# Patient Record
Sex: Male | Born: 1985 | Hispanic: Refuse to answer | Smoking: Never smoker
Health system: Southern US, Community
[De-identification: ages and names within clinical notes are randomized; demographics above are authoritative.]

## PROBLEM LIST (undated history)

## (undated) DIAGNOSIS — I10 Essential (primary) hypertension: Secondary | ICD-10-CM

## (undated) DIAGNOSIS — E785 Hyperlipidemia, unspecified: Secondary | ICD-10-CM

## (undated) DIAGNOSIS — T7840XA Allergy, unspecified, initial encounter: Secondary | ICD-10-CM

## (undated) DIAGNOSIS — E669 Obesity, unspecified: Secondary | ICD-10-CM

## (undated) DIAGNOSIS — E119 Type 2 diabetes mellitus without complications: Secondary | ICD-10-CM

## (undated) DIAGNOSIS — E559 Vitamin D deficiency, unspecified: Secondary | ICD-10-CM

## (undated) HISTORY — DX: Vitamin D deficiency, unspecified: E55.9

## (undated) HISTORY — DX: Essential (primary) hypertension: I10

## (undated) HISTORY — DX: Allergy, unspecified, initial encounter: T78.40XA

## (undated) HISTORY — DX: Hyperlipidemia, unspecified: E78.5

## (undated) HISTORY — DX: Type 2 diabetes mellitus without complications: E11.9

## (undated) HISTORY — DX: Obesity, unspecified: E66.9

---

## 2005-01-27 HISTORY — PX: TONSILLECTOMY: SUR1361

## 2012-12-08 ENCOUNTER — Ambulatory Visit: Payer: Self-pay | Admitting: Physician Assistant

## 2012-12-23 ENCOUNTER — Encounter: Payer: Self-pay | Admitting: Internal Medicine

## 2012-12-23 DIAGNOSIS — E119 Type 2 diabetes mellitus without complications: Secondary | ICD-10-CM | POA: Insufficient documentation

## 2012-12-23 DIAGNOSIS — T7840XA Allergy, unspecified, initial encounter: Secondary | ICD-10-CM | POA: Insufficient documentation

## 2012-12-23 DIAGNOSIS — E1169 Type 2 diabetes mellitus with other specified complication: Secondary | ICD-10-CM | POA: Insufficient documentation

## 2012-12-23 DIAGNOSIS — I10 Essential (primary) hypertension: Secondary | ICD-10-CM | POA: Insufficient documentation

## 2012-12-23 DIAGNOSIS — E785 Hyperlipidemia, unspecified: Secondary | ICD-10-CM | POA: Insufficient documentation

## 2012-12-23 DIAGNOSIS — E1165 Type 2 diabetes mellitus with hyperglycemia: Secondary | ICD-10-CM | POA: Insufficient documentation

## 2012-12-23 DIAGNOSIS — E559 Vitamin D deficiency, unspecified: Secondary | ICD-10-CM | POA: Insufficient documentation

## 2012-12-23 DIAGNOSIS — IMO0002 Reserved for concepts with insufficient information to code with codable children: Secondary | ICD-10-CM | POA: Insufficient documentation

## 2012-12-27 ENCOUNTER — Ambulatory Visit (INDEPENDENT_AMBULATORY_CARE_PROVIDER_SITE_OTHER): Payer: BC Managed Care – PPO | Admitting: Physician Assistant

## 2012-12-27 ENCOUNTER — Encounter: Payer: Self-pay | Admitting: Physician Assistant

## 2012-12-27 ENCOUNTER — Other Ambulatory Visit: Payer: Self-pay | Admitting: Physician Assistant

## 2012-12-27 VITALS — BP 132/82 | HR 60 | Temp 98.2°F | Resp 16 | Wt 247.0 lb

## 2012-12-27 DIAGNOSIS — E782 Mixed hyperlipidemia: Secondary | ICD-10-CM

## 2012-12-27 DIAGNOSIS — E785 Hyperlipidemia, unspecified: Secondary | ICD-10-CM

## 2012-12-27 DIAGNOSIS — E119 Type 2 diabetes mellitus without complications: Secondary | ICD-10-CM

## 2012-12-27 DIAGNOSIS — I10 Essential (primary) hypertension: Secondary | ICD-10-CM

## 2012-12-27 LAB — HEPATIC FUNCTION PANEL
ALT: 79 U/L — ABNORMAL HIGH (ref 0–53)
AST: 42 U/L — ABNORMAL HIGH (ref 0–37)
Albumin: 5 g/dL (ref 3.5–5.2)
Alkaline Phosphatase: 66 U/L (ref 39–117)
Bilirubin, Direct: 0.1 mg/dL (ref 0.0–0.3)
Indirect Bilirubin: 0.4 mg/dL (ref 0.0–0.9)
Total Bilirubin: 0.5 mg/dL (ref 0.3–1.2)
Total Protein: 7.7 g/dL (ref 6.0–8.3)

## 2012-12-27 LAB — LIPID PANEL
Cholesterol: 239 mg/dL — ABNORMAL HIGH (ref 0–200)
HDL: 44 mg/dL (ref >39)
LDL Cholesterol: 171 mg/dL — ABNORMAL HIGH (ref 0–99)
Total CHOL/HDL Ratio: 5.4 Ratio
Triglycerides: 122 mg/dL (ref <150)
VLDL: 24 mg/dL (ref 0–40)

## 2012-12-27 LAB — BASIC METABOLIC PANEL WITHOUT GFR
BUN: 10 mg/dL (ref 6–23)
CO2: 25 meq/L (ref 19–32)
Calcium: 10.4 mg/dL (ref 8.4–10.5)
Chloride: 103 meq/L (ref 96–112)
Creat: 1.07 mg/dL (ref 0.50–1.35)
GFR, Est African American: >89 mL/min
GFR, Est Non African American: >89 mL/min
Glucose, Bld: 100 mg/dL — ABNORMAL HIGH (ref 70–99)
Potassium: 4.3 meq/L (ref 3.5–5.3)
Sodium: 139 meq/L (ref 135–145)

## 2012-12-27 NOTE — Patient Instructions (Signed)

## 2012-12-27 NOTE — Progress Notes (Signed)
HPI Patient presents for a one month follow up. He has had recent weight gains and so last visit his medication were changed from Metformin to Invokamet. Weight is down 10 lbs with the new medications. He states that his BP is better at home with the invokamet and he denies dizziness. He is peeing more but this is due to invokana. He also states he has been trying to eat better and working out. His cholesterol was 165 so he was started on the crestor 40mg  1/2 pill QOD and denies myalgias.   Past Medical History  Diagnosis Date  . Hyperlipidemia   . Hypertension   . Type II or unspecified type diabetes mellitus without mention of complication, not stated as uncontrolled   . Allergy   . Vitamin D deficiency      No Known Allergies    Current Outpatient Prescriptions on File Prior to Visit  Medication Sig Dispense Refill  . Canagliflozin-Metformin HCl (INVOKAMET) [id number] MG TABS Take by mouth 2 (two) times daily.      . Cholecalciferol (VITAMIN D PO) Take 5,000 Int'l Units by mouth daily.      . rosuvastatin (CRESTOR) 40 MG tablet Take 40 mg by mouth daily. Taking 1/2 daily       No current facility-administered medications on file prior to visit.    ROS: all negative expect above.   Physical: Filed Weights   12/27/12 1105  Weight: 247 lb (112.038 kg)   Filed Vitals:   12/27/12 1105  BP: 132/82  Pulse: 60  Temp: 98.2 F (36.8 C)  Resp: 16   General Appearance: Well nourished, in no apparent distress. Eyes: PERRLA, EOMs. Sinuses: No Frontal/maxillary tenderness ENT/Mouth: Ext aud canals clear, normal light reflex with TMs without erythema, bulging. Post pharynx without erythema, swelling, exudate.  Respiratory: CTAB Cardio: RRR, no murmurs, rubs or gallops. Peripheral pulses brisk and equal bilaterally, without edema. No aortic or femoral bruits. Abdomen: Flat, soft, with bowl sounds. Nontender, no guarding, rebound. Lymphatics: Non tender without lymphadenopathy.   Musculoskeletal: Full ROM all peripheral extremities, 5/5 strength, and normal gait. Skin: Warm, dry without rashes, lesions, ecchymosis.  Neuro: Cranial nerves intact, reflexes equal bilaterally. Normal muscle tone, no cerebellar symptoms. Sensation intact.  Pysch: Awake and oriented X 3, normal affect, Insight and Judgment appropriate.   Assessment and Plan: 1. Type II or unspecified type diabetes mellitus without mention of complication, not stated as uncontrolled Continue invokamet and weight loss - BASIC METABOLIC PANEL WITH GFR  2. Hyperlipidemia Continue Crestor - Hepatic function panel - Lipid panel

## 2012-12-28 LAB — HEPATITIS PANEL, ACUTE
HCV Ab: NEGATIVE
Hep A IgM: NONREACTIVE
Hep B C IgM: NONREACTIVE
Hepatitis B Surface Ag: NEGATIVE

## 2013-02-02 ENCOUNTER — Other Ambulatory Visit: Payer: Self-pay | Admitting: Physician Assistant

## 2013-02-02 DIAGNOSIS — E785 Hyperlipidemia, unspecified: Secondary | ICD-10-CM

## 2013-02-03 ENCOUNTER — Other Ambulatory Visit: Payer: Self-pay

## 2013-03-09 ENCOUNTER — Other Ambulatory Visit: Payer: Self-pay | Admitting: Physician Assistant

## 2013-03-09 MED ORDER — ROSUVASTATIN CALCIUM 40 MG PO TABS: 40.0000 mg | ORAL_TABLET | Freq: Every day | ORAL | Status: DC

## 2013-03-09 MED ORDER — LISINOPRIL 40 MG PO TABS: 40.0000 mg | ORAL_TABLET | Freq: Every day | ORAL | Status: DC

## 2013-03-15 ENCOUNTER — Encounter: Payer: Self-pay | Admitting: Physician Assistant

## 2013-04-19 ENCOUNTER — Encounter: Payer: Self-pay | Admitting: Physician Assistant

## 2013-05-26 ENCOUNTER — Encounter: Payer: Self-pay | Admitting: Physician Assistant

## 2013-05-26 ENCOUNTER — Ambulatory Visit (INDEPENDENT_AMBULATORY_CARE_PROVIDER_SITE_OTHER): Payer: BC Managed Care – PPO | Admitting: Physician Assistant

## 2013-05-26 VITALS — BP 132/82 | HR 88 | Temp 97.9°F | Resp 16 | Ht 69.0 in | Wt 245.0 lb

## 2013-05-26 DIAGNOSIS — Z1159 Encounter for screening for other viral diseases: Secondary | ICD-10-CM

## 2013-05-26 DIAGNOSIS — E559 Vitamin D deficiency, unspecified: Secondary | ICD-10-CM

## 2013-05-26 DIAGNOSIS — Z23 Encounter for immunization: Secondary | ICD-10-CM

## 2013-05-26 DIAGNOSIS — E119 Type 2 diabetes mellitus without complications: Secondary | ICD-10-CM

## 2013-05-26 DIAGNOSIS — R740 Nonspecific elevation of levels of transaminase and lactic acid dehydrogenase [LDH]: Secondary | ICD-10-CM

## 2013-05-26 DIAGNOSIS — R7402 Elevation of levels of lactic acid dehydrogenase (LDH): Secondary | ICD-10-CM

## 2013-05-26 DIAGNOSIS — R7401 Elevation of levels of liver transaminase levels: Secondary | ICD-10-CM

## 2013-05-26 DIAGNOSIS — I10 Essential (primary) hypertension: Secondary | ICD-10-CM

## 2013-05-26 DIAGNOSIS — Z0000 Encounter for general adult medical examination without abnormal findings: Secondary | ICD-10-CM

## 2013-05-26 DIAGNOSIS — Z79899 Other long term (current) drug therapy: Secondary | ICD-10-CM

## 2013-05-26 LAB — CBC WITH DIFFERENTIAL/PLATELET
Basophils Absolute: 0 10*3/uL (ref 0.0–0.1)
Basophils Relative: 0 % (ref 0–1)
Eosinophils Absolute: 0.1 10*3/uL (ref 0.0–0.7)
Eosinophils Relative: 1 % (ref 0–5)
HCT: 44.8 % (ref 39.0–52.0)
Hemoglobin: 15.5 g/dL (ref 13.0–17.0)
Lymphocytes Relative: 43 % (ref 12–46)
Lymphs Abs: 2.4 10*3/uL (ref 0.7–4.0)
MCH: 28.2 pg (ref 26.0–34.0)
MCHC: 34.6 g/dL (ref 30.0–36.0)
MCV: 81.6 fL (ref 78.0–100.0)
Monocytes Absolute: 0.3 10*3/uL (ref 0.1–1.0)
Monocytes Relative: 6 % (ref 3–12)
Neutro Abs: 2.8 10*3/uL (ref 1.7–7.7)
Neutrophils Relative %: 50 % (ref 43–77)
Platelets: 317 10*3/uL (ref 150–400)
RBC: 5.49 MIL/uL (ref 4.22–5.81)
RDW: 14 % (ref 11.5–15.5)
WBC: 5.6 10*3/uL (ref 4.0–10.5)

## 2013-05-26 NOTE — Patient Instructions (Addendum)
Bad carbs also include fruit juice, alcohol, and sweet tea. These are empty calories that do not signal to your brain that you are full.   Please remember the good carbs are still carbs which convert into sugar. So please measure them out no more than 1/2-1 cup of rice, oatmeal, pasta, and beans.  Veggies are however free foods! Pile them on.   I like lean protein at every meal such as chicken, Mal[place]urkey, pork chops, cottage cheese, etc. Just do not fry these meats and please center your meal around vegetable, the meats should be a side dish.   Not all fruit is created equal. Please see the list below, the fruit at the bottom is higher in sugars than the fruit at the top   Obesity Obesity is defined as having too much total body fat and a body mass index (BMI) of 30 or more. BMI is an estimate of body fat and is calculated from your height and weight. Obesity happens when you consume more calories than you can burn by exercising or performing daily physical tasks. Prolonged obesity can cause major illnesses or emergencies, such as:   A stroke.  Heart disease.  Diabetes.  Cancer.  Arthritis.  High blood pressure (hypertension).  High cholesterol.  Sleep apnea.  Erectile dysfunction.  Infertility problems. CAUSES   Regularly eating unhealthy foods.  Physical inactivity.  Certain disorders, such as an underactive thyroid (hypothyroidism), Cushing's syndrome, and polycystic ovarian syndrome.  Certain medicines, such as steroids, some depression medicines, and antipsychotics.  Genetics.  Lack of sleep. DIAGNOSIS  A caregiver can diagnose obesity after calculating your BMI. Obesity will be diagnosed if your BMI is 30 or higher.  There are other methods of measuring obesity levels. Some other methods include measuring your skin fold thickness, your waist circumference, and comparing your hip circumference to your waist circumference. TREATMENT  A healthy treatment program  includes some or all of the following:  Long-term dietary changes.  Exercise and physical activity.  Behavioral and lifestyle changes.  Medicine only under the supervision of your caregiver. Medicines may help, but only if they are used with diet and exercise programs. An unhealthy treatment program includes:  Fasting.  Fad diets.  Supplements and drugs. These choices do not succeed in long-term weight control.  HOME CARE INSTRUCTIONS   Exercise and perform physical activity as directed by your caregiver. To increase physical activity, try the following:  Use stairs instead of elevators.  Park farther away from store entrances.  Garden, bike, or walk instead of watching television or using the computer.  Eat healthy, low-calorie foods and drinks on a regular basis. Eat more fruits and vegetables. Use low-calorie cookbooks or take healthy cooking classes.  Limit fast food, sweets, and processed snack foods.  Eat smaller portions.  Keep a daily journal of everything you eat. There are many free websites to help you with this. It may be helpful to measure your foods so you can determine if you are eating the correct portion sizes.  Avoid drinking alcohol. Drink more water and drinks without calories.  Take vitamins and supplements only as recommended by your caregiver.  Weight-loss support groups, Optometristegistered Dieticians, counselors, and stress reduction education can also be very helpful. SEEK IMMEDIATE MEDICAL CARE IF:  You have chest pain or tightness.  You have trouble breathing or feel short of breath.  You have weakness or leg numbness.  You feel confused or have trouble talking.  You have sudden changes  in your vision. MAKE SURE YOU:  Understand these instructions.  Will watch your condition.  Will get help right away if you are not doing well or get worse. Document Released: 02/21/2004 Document Revised: 07/15/2011 Document Reviewed: 02/19/2011 [facility] LPExitCare  Patient Information 2014 [place], [place].  Diabetes and Small Vessel Disease Small vessel disease (microvascular disease) includes nephropathy, retinopathy, and neuropathy. People with diabetes are at risk for these problems, but keeping blood glucose (sugar) controlled is helpful in preventing problems. DIABETIC KIDNEY PROBLEMS (DIABETIC NEPHROPATHY)  Diabetic nephropathy occurs in many patients with diabetes.  Damage to the small vessels in the kidneys is the leading cause of end-stage renal disease (ESRD).  Protein in the urine (albuminuria) in the range of 30 to 300 mg/24 h (microalbuminuria) is a sign of the earliest stage of diabetic nephropathy.  Good blood glucose (sugar) and blood pressure control significantly reduce the progression of nephropathy. DIABETIC EYE PROBLEMS (DIABETIC RETINOPATHY)  Diabetic retinopathy is the most common cause of new cases of blindness in adults. It is related to the number of years you have had diabetes.  Common risk factors include high blood sugar (hyperglycemia), high blood pressure (hypertension), and poorly controlled blood lipids such as high blood cholesterol (hypercholesterolemia). DIABETIC NERVE PROBLEMS (DIABETIC NEUROPATHY) Diabetic neuropathy is the most common, long-term complication of diabetes. It is responsible for more than half of leg amputations not due to accidents. The main risk for developing diabetic neuropathy seems to be uncontrolled blood sugars. Hyperglycemia damages the nerve fibers causing sensation (feeling) problems. The closer you can keep the following guidelines, the better chance you will have avoiding problems from small vessel disease.  Working toward near normal blood glucose or as normal as possible. You will need to keep your blood glucose and A1c at the target range prescribed by your caregiver.  Keep your blood pressure less than 120/80.  Keep your low-density lipoprotein (LDL) cholesterol (one of the fats in  your blood) at less than 100 mg/dL. An LDL less than 70 mg/dL may be recommended for high risk patients. You cannot change your family history, but it is important to change the risk factors that you can. Risk factors you can control include:  Controlling high blood pressure.  Stopping smoking.  Using alcohol only in moderation. Generally, this means about one drink per day for women and two drinks per day for men.  Controlling your blood lipids (cholesterol and triglycerides).  Treating heart problems, if these are contributing to risk. SEEK MEDICAL CARE IF:   You are having problems keeping your blood glucose in goal range.  You notice a change in your vision or new problems with your vision.  You have wound or sore that does not heal.  Your blood pressure is above the target range. Document Released: 01/16/2003 Document Revised: 12/31/2011 Document Reviewed: 06/23/2008 [facility] Patient Information 2014 [place], [place].

## 2013-05-26 NOTE — Progress Notes (Signed)
Complete Physical HPI 28 y.o. male  presents for a complete physical. His blood pressure has been controlled at home, he is on lisinopril 40mg  daily, today their BP is BP: 132/82 mmHg He does not workout. He denies chest pain, shortness of breath, dizziness.  He is on cholesterol medication and denies myalgias. His cholesterol is not at goal, in Dec he was suppose to increase his crestor to 1/2 pill daily and follow up due to increased LFTs but he never followed up and has been taking 1 crestor daily. The cholesterol last visit was:   Lab Results  Component Value Date   CHOL 239* 12/27/2012   HDL 44 12/27/2012   LDLCALC 171* 12/27/2012   TRIG 122 12/27/2012   CHOLHDL 5.4 12/27/2012   Lab Results  Component Value Date   ALT 79* 12/27/2012   AST 42* 12/27/2012   ALKPHOS 66 12/27/2012   BILITOT 0.5 12/27/2012   He has been working on diet and exercise for diabetes, he has been checking his sugars at home and 113 is the highest in the AM, lowest is 93, no low blood sugars, and denies paresthesia of the feet, polydipsia and visual disturbances. Last A1C in the office was: A1C 6.8 Patient is not on Vitamin D supplement. Vitamin D 37  Wt Readings from Last 3 Encounters:  05/26/13 245 lb (111.131 kg)  12/27/12 247 lb (112.038 kg)   Current Medications:  Current Outpatient Prescriptions on File Prior to Visit  Medication Sig Dispense Refill  . Canagliflozin-Metformin HCl (INVOKAMET) [phone] MG TABS Take by mouth 2 (two) times daily.      . Cholecalciferol (VITAMIN D PO) Take 5,000 Int'l Units by mouth daily.      Marland Kitchen. lisinopril (PRINIVIL,ZESTRIL) 40 MG tablet Take 1 tablet (40 mg total) by mouth daily.  90 tablet  1  . rosuvastatin (CRESTOR) 40 MG tablet Take 1 tablet (40 mg total) by mouth daily.  90 tablet  1   No current facility-administered medications on file prior to visit.   Health Maintenance:  Immunization History  Administered Date(s) Administered  . Tdap 01/28/2003   Tetanus:  2005 Pneumovax: N/A Flu vaccine: 2014 Zostavax: N/A DEXA: N/A Colonoscopy: N/A EGD: N/A Eye doctor: no eye doctor Dentist: No dentist  Allergies: No Known Allergies Medical History:  Past Medical History  Diagnosis Date  . Hyperlipidemia   . Hypertension   . Type II or unspecified type diabetes mellitus without mention of complication, not stated as uncontrolled   . Allergy   . Vitamin D deficiency   . Obesity (BMI 30-39.9)    Surgical History:  Past Surgical History  Procedure Laterality Date  . Tonsillectomy  2007   Family History:  Family History  Problem Relation Age of Onset  . Diabetes Maternal Grandfather    Social History:   History  Substance Use Topics  . Smoking status: Never Smoker   . Smokeless tobacco: Never Used  . Alcohol Use: No   ROS:  [X]  = complains of  [ ]  = denies  General: Fatigue [ ]  Fever [ ]  Chills [ ]  Weakness [ ]   Insomnia [ ]  Weight change [ ]  Night sweats [ ]   Change in appetite [ ]  Eyes: Redness [ ]  Blurred vision [ ]  Diplopia [ ]  Discharge [ ]   ENT: Congestion [ ]  Sinus Pain [ ]  Post Nasal Drip [ ]  Sore Throat [ ]  Earache [ ]  hearing loss [ ]  Tinnitus [ ]  Snoring Arly.Keller[X ]  Cardiac: Chest pain/pressure [ ]  SOB [ ]  Orthopnea [ ]   Palpitations [ ]   Paroxysmal nocturnal dyspnea[ ]  Claudication [ ]  Edema [ ]   Pulmonary: Cough [ ]  Wheezing[ ]   SOB [ ]   Pleurisy [ ]   GI: Nausea [ ]  Vomiting[ ]  Dysphagia[ ]  Heartburn[ ]  Abdominal pain [ ]  Constipation [ ] ; Diarrhea [ ]  BRBPR [ ]  Melena[ ]  Bloating [ ]  Hemorrhoids [ ]   GU: Hematuria[ ]  Dysuria [ ]  Nocturia[ ]  Urgency [ ]   Hesitancy [ ]  Discharge [ ]  Frequency [ ]   Neuro: Headaches[ ]  Vertigo[ ]  Paresthesias[ ]  Spasm [ ]  Speech changes [ ]  Incoordination [ ]   Ortho: Arthritis [ ]  Joint pain [ ]  Muscle pain [ ]  Joint swelling [ ]  Back Pain [ ]  Skin:  Rash [ ]   Pruritis [ ]  Change in skin lesion [ ]   Psych: Depression[ ]  Anxiety[ ]  Confusion [ ]  Memory loss [ ]   Heme/Lypmh: Bleeding [ ]  Bruising [ ]   Enlarged lymph nodes [ ]   Endocrine: Visual blurring [ ]  Paresthesia [ ]  Polyuria [ ]  Polydypsea [ ]    Heat/cold intolerance [ ]  Hypoglycemia [ ]   Physical Exam: Estimated body mass index is 36.16 kg/(m^2) as calculated from the following:   Height as of this encounter: 5\' 9"  (1.753 m).   Weight as of this encounter: 245 lb (111.131 kg). BP 132/82  Pulse 88  Temp(Src) 97.9 F (36.6 C)  Resp 16  Ht 5\' 9"  (1.753 m)  Wt 245 lb (111.131 kg)  BMI 36.16 kg/m2 General Appearance: Well nourished, in no apparent distress. Eyes: PERRLA, EOMs, conjunctiva no swelling or erythema, normal fundi and vessels. Sinuses: No Frontal/maxillary tenderness ENT/Mouth: Ext aud canals clear, normal light reflex with TMs without erythema, bulging. Good dentition. No erythema, swelling, or exudate on post pharynx. Tonsils not swollen or erythematous. Hearing normal.  Neck: Supple, thyroid normal. No bruits Respiratory: Respiratory effort normal, BS equal bilaterally without rales, rhonchi, wheezing or stridor. Cardio: RRR without murmurs, rubs or gallops. Brisk peripheral pulses without edema.  Chest: symmetric, with normal excursions and percussion. Abdomen: Soft, +BS. Non tender, no guarding, rebound, hernias, masses, or organomegaly. .  Lymphatics: Non tender without lymphadenopathy.  Genitourinary: defer Musculoskeletal: Full ROM all peripheral extremities,5/5 strength, and normal gait. Skin: Warm, dry without rashes, lesions, ecchymosis.  Neuro: Cranial nerves intact, reflexes equal bilaterally. Normal muscle tone, no cerebellar symptoms. Sensation intact.  Psych: Awake and oriented X 3, normal affect, Insight and Judgment appropriate.   EKG: WNL no changes.  Assessment and Plan: Hyperlipidemia--continue medications, check lipids, decrease fatty foods, increase activity.   Hypertension-- continue medications, DASH diet, exercise and monitor at home. Call if greater than 130/80.   Type II or  unspecified type diabetes mellitus without mention of complication, not stated as uncontrolled- Discussed general issues about diabetes pathophysiology and management., Educational material distributed., Suggested low cholesterol diet., Encouraged aerobic exercise., Discussed foot care., Reminded to get yearly retinal exam.  Allergic rhinitis- Allegra OTC, increase H20, allergy hygiene explained.  Vitamin D deficiency- check lab  Obesity (BMI 30-39.9)- discussed diet and exercise  Elevated LFTs- discussed that he needs to keep follow ups and dicussed the importance of compliance, recheck LFTS and hepatitis panel.  Noncompliance- discussed very important to keep appointments and return calls especially with new med starts etc.   Discussed med's effects and SE's. Screening labs and tests as requested with regular follow-up as recommended.  Louis MullingAmanda Collier 3:56 PM

## 2013-05-27 LAB — BASIC METABOLIC PANEL WITH GFR
BUN: 14 mg/dL (ref 6–23)
CO2: 25 mEq/L (ref 19–32)
Calcium: 9.8 mg/dL (ref 8.4–10.5)
Chloride: 99 mEq/L (ref 96–112)
Creat: 1.02 mg/dL (ref 0.50–1.35)
GFR, Est African American: >89 mL/min
GFR, Est Non African American: >89 mL/min
Glucose, Bld: 111 mg/dL — ABNORMAL HIGH (ref 70–99)
Potassium: 4 mEq/L (ref 3.5–5.3)
Sodium: 134 mEq/L — ABNORMAL LOW (ref 135–145)

## 2013-05-27 LAB — HEPATITIS B SURFACE ANTIBODY,QUALITATIVE: Hep B S Ab: NEGATIVE

## 2013-05-27 LAB — URINALYSIS, MICROSCOPIC ONLY
Bacteria, UA: NONE SEEN
Casts: NONE SEEN
Crystals: NONE SEEN
Squamous Epithelial / LPF: NONE SEEN

## 2013-05-27 LAB — URINALYSIS, ROUTINE W REFLEX MICROSCOPIC
Bilirubin Urine: NEGATIVE
Glucose, UA: >1000 mg/dL — AB
Hgb urine dipstick: NEGATIVE
Ketones, ur: NEGATIVE mg/dL
Leukocytes, UA: NEGATIVE
Nitrite: NEGATIVE
Protein, ur: NEGATIVE mg/dL
Specific Gravity, Urine: >1.03 — ABNORMAL HIGH (ref 1.005–1.030)
Urobilinogen, UA: 1 mg/dL (ref 0.0–1.0)
pH: 6 (ref 5.0–8.0)

## 2013-05-27 LAB — HEPATIC FUNCTION PANEL
ALT: 73 U/L — ABNORMAL HIGH (ref 0–53)
AST: 35 U/L (ref 0–37)
Albumin: 4.9 g/dL (ref 3.5–5.2)
Alkaline Phosphatase: 63 U/L (ref 39–117)
Bilirubin, Direct: 0.1 mg/dL (ref 0.0–0.3)
Indirect Bilirubin: 0.5 mg/dL (ref 0.2–1.2)
Total Bilirubin: 0.6 mg/dL (ref 0.2–1.2)
Total Protein: 7.3 g/dL (ref 6.0–8.3)

## 2013-05-27 LAB — LIPID PANEL
Cholesterol: 115 mg/dL (ref 0–200)
HDL: 40 mg/dL (ref >39)
LDL Cholesterol: 52 mg/dL (ref 0–99)
Total CHOL/HDL Ratio: 2.9 Ratio
Triglycerides: 116 mg/dL (ref <150)
VLDL: 23 mg/dL (ref 0–40)

## 2013-05-27 LAB — IRON AND TIBC
%SAT: 14 % — ABNORMAL LOW (ref 20–55)
Iron: 50 ug/dL (ref 42–165)
TIBC: 369 ug/dL (ref 215–435)
UIBC: 319 ug/dL (ref 125–400)

## 2013-05-27 LAB — HEPATITIS A ANTIBODY, TOTAL: Hep A Total Ab: NONREACTIVE

## 2013-05-27 LAB — HEMOGLOBIN A1C
Hgb A1c MFr Bld: 6.5 % — ABNORMAL HIGH (ref <5.7)
Mean Plasma Glucose: 140 mg/dL — ABNORMAL HIGH (ref <117)

## 2013-05-27 LAB — MICROALBUMIN / CREATININE URINE RATIO
Creatinine, Urine: 129.3 mg/dL
Microalb Creat Ratio: 7.2 mg/g (ref 0.0–30.0)
Microalb, Ur: 0.93 mg/dL (ref 0.00–1.89)

## 2013-05-27 LAB — TSH: TSH: 1.442 u[IU]/mL (ref 0.350–4.500)

## 2013-05-27 LAB — INSULIN, FASTING: Insulin fasting, serum: 66 u[IU]/mL — ABNORMAL HIGH (ref 3–28)

## 2013-05-27 LAB — HEPATITIS B CORE ANTIBODY, TOTAL: Hep B Core Total Ab: NONREACTIVE

## 2013-05-27 LAB — VITAMIN B12: Vitamin B-12: 444 pg/mL (ref 211–911)

## 2013-05-27 LAB — HEPATITIS C ANTIBODY: HCV Ab: NEGATIVE

## 2013-05-27 LAB — MAGNESIUM: Magnesium: 2 mg/dL (ref 1.5–2.5)

## 2013-05-27 LAB — VITAMIN D 25 HYDROXY (VIT D DEFICIENCY, FRACTURES): Vit D, 25-Hydroxy: 15 ng/mL — ABNORMAL LOW (ref 30–89)

## 2013-05-30 ENCOUNTER — Encounter: Payer: Self-pay | Admitting: Physician Assistant

## 2013-05-30 LAB — HEPATITIS B E ANTIBODY: Hepatitis Be Antibody: NONREACTIVE

## 2013-06-09 ENCOUNTER — Encounter: Payer: Self-pay | Admitting: Physician Assistant

## 2013-06-09 ENCOUNTER — Other Ambulatory Visit: Payer: Self-pay | Admitting: Physician Assistant

## 2013-06-09 MED ORDER — LOSARTAN POTASSIUM 100 MG PO TABS: 100.0000 mg | ORAL_TABLET | Freq: Every day | ORAL | Status: DC

## 2013-07-10 ENCOUNTER — Encounter: Payer: Self-pay | Admitting: Physician Assistant

## 2013-07-12 ENCOUNTER — Encounter: Payer: Self-pay | Admitting: Physician Assistant

## 2013-10-06 ENCOUNTER — Other Ambulatory Visit: Payer: Self-pay | Admitting: Physician Assistant

## 2013-10-25 ENCOUNTER — Telehealth: Payer: Self-pay | Admitting: Internal Medicine

## 2013-10-25 NOTE — Telephone Encounter (Signed)
PATIENT SAID HE WILL CALL BACK TO SCHEDULE AN OFFICE VISIT AND LABS. 10-25-13 KAW  Thank you, Katrina Webb SilversmithWelch [facility] [phone] Fax [phone]

## 2013-11-16 ENCOUNTER — Other Ambulatory Visit: Payer: Self-pay | Admitting: Internal Medicine

## 2013-12-01 ENCOUNTER — Ambulatory Visit: Payer: Self-pay | Admitting: Physician Assistant

## 2013-12-06 ENCOUNTER — Other Ambulatory Visit: Payer: Self-pay | Admitting: *Deleted

## 2013-12-06 MED ORDER — LOSARTAN POTASSIUM 100 MG PO TABS: 100.0000 mg | ORAL_TABLET | Freq: Every day | ORAL | Status: DC

## 2013-12-31 ENCOUNTER — Other Ambulatory Visit: Payer: Self-pay | Admitting: Physician Assistant

## 2014-01-11 ENCOUNTER — Ambulatory Visit: Payer: Self-pay | Admitting: Physician Assistant

## 2014-02-03 ENCOUNTER — Other Ambulatory Visit: Payer: Self-pay | Admitting: Internal Medicine

## 2014-02-22 ENCOUNTER — Encounter: Payer: Self-pay | Admitting: Physician Assistant

## 2014-02-22 ENCOUNTER — Ambulatory Visit (INDEPENDENT_AMBULATORY_CARE_PROVIDER_SITE_OTHER): Payer: BLUE CROSS/BLUE SHIELD | Admitting: Physician Assistant

## 2014-02-22 VITALS — BP 142/90 | HR 80 | Temp 97.9°F | Resp 16 | Ht 69.0 in | Wt 247.0 lb

## 2014-02-22 DIAGNOSIS — T7840XA Allergy, unspecified, initial encounter: Secondary | ICD-10-CM

## 2014-02-22 DIAGNOSIS — I10 Essential (primary) hypertension: Secondary | ICD-10-CM

## 2014-02-22 DIAGNOSIS — Z79899 Other long term (current) drug therapy: Secondary | ICD-10-CM

## 2014-02-22 DIAGNOSIS — E785 Hyperlipidemia, unspecified: Secondary | ICD-10-CM

## 2014-02-22 DIAGNOSIS — E559 Vitamin D deficiency, unspecified: Secondary | ICD-10-CM

## 2014-02-22 DIAGNOSIS — E669 Obesity, unspecified: Secondary | ICD-10-CM

## 2014-02-22 DIAGNOSIS — E119 Type 2 diabetes mellitus without complications: Secondary | ICD-10-CM

## 2014-02-22 LAB — CBC WITH DIFFERENTIAL/PLATELET
Basophils Absolute: 0 10*3/uL (ref 0.0–0.1)
Basophils Relative: 0 % (ref 0–1)
Eosinophils Absolute: 0 10*3/uL (ref 0.0–0.7)
Eosinophils Relative: 1 % (ref 0–5)
HCT: 45.9 % (ref 39.0–52.0)
Hemoglobin: 15.7 g/dL (ref 13.0–17.0)
Lymphocytes Relative: 48 % — ABNORMAL HIGH (ref 12–46)
Lymphs Abs: 2.3 10*3/uL (ref 0.7–4.0)
MCH: 28.2 pg (ref 26.0–34.0)
MCHC: 34.2 g/dL (ref 30.0–36.0)
MCV: 82.4 fL (ref 78.0–100.0)
MPV: 10.5 fL (ref 8.6–12.4)
Monocytes Absolute: 0.3 10*3/uL (ref 0.1–1.0)
Monocytes Relative: 6 % (ref 3–12)
Neutro Abs: 2.2 10*3/uL (ref 1.7–7.7)
Neutrophils Relative %: 45 % (ref 43–77)
Platelets: 290 10*3/uL (ref 150–400)
RBC: 5.57 MIL/uL (ref 4.22–5.81)
RDW: 13.3 % (ref 11.5–15.5)
WBC: 4.8 10*3/uL (ref 4.0–10.5)

## 2014-02-22 MED ORDER — LOSARTAN POTASSIUM 100 MG PO TABS: 100.0000 mg | ORAL_TABLET | Freq: Every day | ORAL | Status: DC

## 2014-02-22 MED ORDER — ROSUVASTATIN CALCIUM 40 MG PO TABS: 40.0000 mg | ORAL_TABLET | Freq: Every day | ORAL | Status: DC

## 2014-02-22 MED ORDER — CANAGLIFLOZIN-METFORMIN HCL 150-1000 MG PO TABS: 1.0000 | ORAL_TABLET | Freq: Two times a day (BID) | ORAL | Status: DC

## 2014-02-22 NOTE — Patient Instructions (Signed)
Add ENTERIC COATED low dose 81 mg Aspirin daily OR can do every other day if you have easy bruising to protect your heart and head.   Diabetes is a very complicated disease...lets simplify it.  An easy way to look at it to understand the complications is if you think of the extra sugar floating in your blood stream as glass shards floating through your blood stream.    Diabetes affects your small vessels first: 1) The glass shards (sugar) scraps down the tiny blood vessels in your eyes and lead to diabetic retinopathy, the leading cause of blindness in the [place]. Diabetes is the leading cause of newly diagnosed adult (70 to 30 years of age) blindness in the [place].  2) The glass shards scratches down the tiny vessels of your legs leading to nerve damage called neuropathy and can lead to amputations of your feet. More than 60% of all non-traumatic amputations of lower limbs occur in people with diabetes.  3) Over time the small vessels in your brain are shredded and closed off, individually this does not cause any problems but over a long period of time many of the small vessels being blocked can lead to Vascular Dementia.   4) Your kidney's are a filter system and have a "net" that keeps certain things in the body and lets bad things out. Sugar shreds this net and leads to kidney damage and eventually failure. Decreasing the sugar that is destroying the net and certain blood pressure medications can help stop or decrease progression of kidney disease. Diabetes was the primary cause of kidney failure in 44 percent of all new cases in 2011.  5) Diabetes also destroys the small vessels in your penis that lead to erectile dysfunction. Eventually the vessels are so damaged that you may not be responsive to cialis or viagra.   Diabetes and your large vessels: Your larger vessels consist of your coronary arteries in your heart and the carotid vessels to your brain. Diabetes or even increased sugars  put you at 300% increased risk of heart attack and stroke and this is why.. The sugar scrapes down your large blood vessels and your body sees this as an internal injury and tries to repair itself. Just like you get a scab on your skin, your platelets will stick to the blood vessel wall trying to heal it. This is why we have diabetics on low dose aspirin daily, this prevents the platelets from sticking and can prevent plaque formation. In addition, your body takes cholesterol and tries to shove it into the open wound. This is why we want your LDL, or bad cholesterol, below 70.   The combination of platelets and cholesterol over 5-10 years forms plaque that can break off and cause a heart attack or stroke.   PLEASE REMEMBER:  Diabetes is preventable! Up to 85 percent of complications and morbidities among individuals with type 2 diabetes can be prevented, delayed, or effectively treated and minimized with regular visits to a health professional, appropriate monitoring and medication, and a healthy diet and lifestyle.  Recommendations For Diabetic/Prediabetic Patients:   -  Take medications as prescribed  -  Recommend Dr Francis Dowse Fuhrman's book "The End of Diabetes "  And "The End of Dieting"- Can get at  www.Amazon.com and encourage also get the Audio CD book  - AVOID Animal products, ie. Meat - red/white, Poultry and Dairy/especially cheese - Exercise at least 5 times a week for 30 minutes or preferably daily.  -  No Smoking - Drink less than 2 drinks a day.  - Monitor your feet for sores - Have yearly Eye Exams - Recommend annual Flu vaccine  - Recommend Pneumovax and Prevnar vaccines - Shingles Vaccine (Zostavax) if over 53 y.o.  Goals:   - BMI less than 24 - Fasting sugar less than 130 or less than 150 if tapering medicines to lose weight  - Systolic BP less than 130  - Diastolic BP less than 80 - Bad LDL Cholesterol less than 70 - Triglycerides less than 150   Before you even begin to  attack a weight-loss plan, it pays to remember this: You are not fat. You have fat. Losing weight isn't about blame or shame; it's simply another achievement to accomplish. Dieting is like any other skill-you have to buckle down and work at it. As long as you act in a smart, reasonable way, you'll ultimately get where you want to be. Here are some weight loss pearls for you.  1. It's Not a Diet. It's a Lifestyle Thinking of a diet as something you're on and suffering through only for the short term doesn't work. To shed weight and keep it off, you need to make permanent changes to the way you eat. It's OK to indulge occasionally, of course, but if you cut calories temporarily and then revert to your old way of eating, you'll gain back the weight quicker than you can say yo-yo. Use it to lose it. Research shows that one of the best predictors of long-term weight loss is how many pounds you drop in the first month. For that reason, nutritionists often suggest being stricter for the first two weeks of your new eating strategy to build momentum. Cut out added sugar and alcohol and avoid unrefined carbs. After that, figure out how you can reincorporate them in a way that's healthy and maintainable.  2. There's a Right Way to Exercise Working out burns calories and fat and boosts your metabolism by building muscle. But those trying to lose weight are notorious for overestimating the number of calories they burn and underestimating the amount they take in. Unfortunately, your system is biologically programmed to hold on to extra pounds and that means when you start exercising, your body senses the deficit and ramps up its hunger signals. If you're not diligent, you'll eat everything you burn and then some. Use it to lose it. Cardio gets all the exercise glory, but strength and interval training are the real heroes. They help you build lean muscle, which in turn increases your metabolism and calorie-burning  ability 3. Don't Overreact to Mild Hunger Some people have a hard time losing weight because of hunger anxiety. To them, being hungry is bad-something to be avoided at all costs-so they carry snacks with them and eat when they don't need to. Others eat because they're stressed out or bored. While you never want to get to the point of being ravenous (that's when bingeing is likely to happen), a hunger pang, a craving, or the fact that it's 3:00 p.m. should not send you racing for the vending machine or obsessing about the energy bar in your purse. Ideally, you should put off eating until your stomach is growling and it's difficult to concentrate.  Use it to lose it. When you feel the urge to eat, use the HALT method. Ask yourself, Am I really hungry? Or am I angry or anxious, lonely or bored, or tired? If you're still not certain, try the  apple test. If you're truly hungry, an apple should seem delicious; if it doesn't, something else is going on. Or you can try drinking water and making yourself busy, if you are still hungry try a healthy snack.  4. Not All Calories Are Created Equal The mechanics of weight loss are pretty simple: Take in fewer calories than you use for energy. But the kind of food you eat makes all the difference. Processed food that's high in saturated fat and refined starch or sugar can cause inflammation that disrupts the hormone signals that tell your brain you're full. The result: You eat a lot more.  Use it to lose it. Clean up your diet. Swap in whole, unprocessed foods, including vegetables, lean protein, and healthy fats that will fill you up and give you the biggest nutritional bang for your calorie buck. In a few weeks, as your brain starts receiving regular hunger and fullness signals once again, you'll notice that you feel less hungry overall and naturally start cutting back on the amount you eat.  5. Protein, Produce, and Plant-Based Fats Are Your Weight-Loss Trinity Here's  why eating the three Ps regularly will help you drop pounds. Protein fills you up. You need it to build lean muscle, which keeps your metabolism humming so that you can torch more fat. People in a weight-loss program who ate double the recommended daily allowance for protein (about 110 grams for a 150-pound woman) lost 70 percent of their weight from fat, while people who ate the RDA lost only about 40 percent, one study found. Produce is packed with filling fiber. "It's very difficult to consume too many calories if you're eating a lot of vegetables. Example: Three cups of broccoli is a lot of food, yet only 93 calories. (Fruit is another story. It can be easy to overeat and can contain a lot of calories from sugar, so be sure to monitor your intake.) Plant-based fats like olive oil and those in avocados and nuts are healthy and extra satiating.  Use it to lose it. Aim to incorporate each of the three Ps into every meal and snack. People who eat protein throughout the day are able to keep weight off, according to a study in the American Journal of Clinical Nutrition. In addition to meat, poultry and seafood, good sources are beans, lentils, eggs, tofu, and yogurt. As for fat, keep portion sizes in check by measuring out salad dressing, oil, and nut butters (shoot for one to two tablespoons). Finally, eat veggies or a little fruit at every meal. People who did that consumed 308 fewer calories but didn't feel any hungrier than when they didn't eat more produce.  7. How You Eat Is As Important As What You Eat In order for your brain to register that you're full, you need to focus on what you're eating. Sit down whenever you eat, preferably at a table. Turn off the TV or computer, put down your phone, and look at your food. Smell it. Chew slowly, and don't put another bite on your fork until you swallow. When women ate lunch this attentively, they consumed 30 percent less when snacking later than those who  listened to an audiobook at lunchtime, according to a study in the [place] Journal of Nutrition. 8. Weighing Yourself Really Works The scale provides the best evidence about whether your efforts are paying off. Seeing the numbers tick up or down or stagnate is motivation to keep going-or to rethink your approach. [facility] 2015 study  [facility] found that daily weigh-ins helped people lose more weight, keep it off, and maintain that loss, even after two years. Use it to lose it. Step on the scale at the same time every day for the best results. If your weight shoots up several pounds from one weigh-in to the next, don't freak out. Eating a lot of salt the night before or having your period is the likely culprit. The number should return to normal in a day or two. It's a steady climb that you need to do something about. 9. Too Much Stress and Too Little Sleep Are Your Enemies When you're tired and frazzled, your body cranks up the production of cortisol, the stress hormone that can cause carb cravings. Not getting enough sleep also boosts your levels of ghrelin, a hormone associated with hunger, while suppressing leptin, a hormone that signals fullness and satiety. People on a diet who slept only five and a half hours a night for two weeks lost 55 percent less fat and were hungrier than those who slept eight and a half hours, according to a study in the [place] [employer] Journal. Use it to lose it. Prioritize sleep, aiming for seven hours or more a night, which research shows helps lower stress. And make sure you're getting quality zzz's. If a snoring spouse or a fidgety cat wakes you up frequently throughout the night, you may end up getting the equivalent of just four hours of sleep, according to a study from [facility] [employer] [facility]. Keep pets out of the bedroom, and use a white-noise app to drown out snoring. 10. You Will Hit a plateau-And You Can Bust Through It As you slim down, your body  releases much less leptin, the fullness hormone.  If you're not strength training, start right now. Building muscle can raise your metabolism to help you overcome a plateau. To keep your body challenged and burning calories, incorporate new moves and more intense intervals into your workouts or add another sweat session to your weekly routine. Alternatively, cut an extra 100 calories or so a day from your diet. Now that you've lost weight, your body simply doesn't need as much fuel.   Ways to cut 100 calories  1. Eat your eggs with hot sauce OR salsa instead of cheese.  Eggs are great for breakfast, but many people consider eggs and cheese to be BFFs. Instead of cheese-1 oz. of cheddar has 114 calories-top your eggs with hot sauce, which contains no calories and helps with satiety and metabolism. Salsa is also a great option!!  2. Top your toast, waffles or pancakes with mashed berries instead of jelly or syrup. Half a cup of berries-fresh, frozen or thawed-has about 40 calories, compared with 2 tbsp. of maple syrup or jelly, which both have about 100 calories. The berries will also give you a good punch of fiber, which helps keep you full and satisfied and won't spike blood sugar quickly like the jelly or syrup. 3. Swap the non-fat latte for black coffee with a splash of half-and-half. Contrary to its name, that non-fat latte has 130 calories and a startling 19g of carbohydrates per 16 oz. serving. Replacing that 'light' drinkable dessert with a black coffee with a splash of half-and-half saves you more than 100 calories per 16 oz. serving. 4. Sprinkle salads with freeze-dried raspberries instead of dried cranberries. If you want a sweet addition to your nutritious salad, stay away from dried cranberries. They have a whopping 130 calories  per  cup and 30g carbohydrates. Instead, sprinkle freeze-dried raspberries guilt-free and save more than 100 calories per  cup serving, adding 3g of belly-filling  fiber. 5. Go for mustard in place of mayo on your sandwich. Mustard can add really nice flavor to any sandwich, and there are tons of varieties, from spicy to honey. A serving of mayo is 95 calories, versus 10 calories in a serving of mustard. 6. Choose a DIY salad dressing instead of the store-bought kind. Mix Dijon or whole grain mustard with low-fat Kefir or red wine vinegar and garlic. 7. Use hummus as a spread instead of a dip. Use hummus as a spread on a high-fiber cracker or tortilla with a sandwich and save on calories without sacrificing taste. 8. Pick just one salad "accessory." Salad isn't automatically a calorie winner. It's easy to over-accessorize with toppings. Instead of topping your salad with nuts, avocado and cranberries (all three will clock in at 313 calories), just pick one. The next day, choose a different accessory, which will also keep your salad interesting. You don't wear all your jewelry every day, right? 9. Ditch the white pasta in favor of spaghetti squash. One cup of cooked spaghetti squash has about 40 calories, compared with traditional spaghetti, which comes with more than 200. Spaghetti squash is also nutrient-dense. It's a good source of fiber and Vitamins A and C, and it can be eaten just like you would eat pasta-with a great tomato sauce and [place] meatballs or with pesto, tofu and spinach, for example. 10. Dress up your chili, soups and stews with non-fat [place] yogurt instead of sour cream. Just a 'dollop' of sour cream can set you back 115 calories and a whopping 12g of fat-seven of which are of the artery-clogging variety. Added bonus: [place] yogurt is packed with muscle-building protein, calcium and B Vitamins. 11. Mash cauliflower instead of mashed potatoes. One cup of traditional mashed potatoes-in all their creamy goodness-has more than 200 calories, compared to mashed cauliflower, which you can typically eat for less than 100 calories per 1 cup serving.  Cauliflower is a great source of the antioxidant indole-3-carbinol (I3C), which may help reduce the risk of some cancers, like breast cancer. 12. Ditch the ice cream sundae in favor of a [place] yogurt parfait. Instead of a cup of ice cream or fro-yo for dessert, try 1 cup of nonfat Greek yogurt topped with fresh berries and a sprinkle of cacao nibs. Both toppings are packed with antioxidants, which can help reduce cellular inflammation and oxidative damage. And the comparison is a no-brainer: One cup of ice cream has about 275 calories; one cup of frozen yogurt has about 230; and a cup of Greek yogurt has just 130, plus twice the protein, so you're less likely to return to the freezer for a second helping. 13. Put olive oil in a spray container instead of using it directly from the bottle. Each tablespoon of olive oil is 120 calories and 15g of fat. Use a mister instead of pouring it straight into the pan or onto a salad. This allows for portion control and will save you more than 100 calories. 14. When baking, substitute canned pumpkin for butter or oil. Canned pumpkin-not pumpkin pie mix-is loaded with Vitamin A, which is important for skin and eye health, as well as immunity. And the comparisons are pretty crazy:  cup of canned pumpkin has about 40 calories, compared to butter or oil, which has more than 800 calories. Yes, 800 calories.  Applesauce and mashed banana can also serve as good substitutions for butter or oil, usually in a 1:1 ratio. 15. Top casseroles with high-fiber cereal instead of breadcrumbs. Breadcrumbs are typically made with white bread, while breakfast cereals contain 5-9g of fiber per serving. Not only will you save more than 150 calories per  cup serving, the swap will also keep you more full and you'll get a metabolism boost from the added fiber. 16. Snack on pistachios instead of macadamia nuts. Believe it or not, you get the same amount of calories from 35 pistachios (100  calories) as you would from only five macadamia nuts. 17. Chow down on kale chips rather than potato chips. This is my favorite 'don't knock it 'till you try it' swap. Kale chips are so easy to make at home, and you can spice them up with a little grated parmesan or chili powder. Plus, they're a mere fraction of the calories of potato chips, but with the same crunch factor we crave so often. 18. Add seltzer and some fruit slices to your cocktail instead of soda or fruit juice. One cup of soda or fruit juice can pack on as much as 140 calories. Instead, use seltzer and fruit slices. The fruit provides valuable phytochemicals, such as flavonoids and anthocyanins, which help to combat cancer and stave off the aging process.

## 2014-02-22 NOTE — Progress Notes (Signed)
Assessment and Plan:  Hypertension: Continue medication, monitor blood pressure at home. Continue DASH diet.  Reminder to go to the ER if any CP, SOB, nausea, dizziness, severe HA, changes vision/speech, left arm numbness and tingling and jaw pain. Cholesterol: Continue diet and exercise. Check cholesterol.  Diabetes without complications-Continue diet and exercise. Check A1C Vitamin D Def- check level and continue medications.  Obesity with co morbidities- long discussion about weight loss, diet, and exercise   Continue diet and meds as discussed. Further disposition pending results of labs. Discussed med's effects and SE's.    HPI 28 y.o. male  presents for 3 month follow up with hypertension, hyperlipidemia, diabetes and vitamin D. His blood pressure is not checked at home , losartan , today their BP is BP: (!) 142/90 mmHg.  He does not workout, due to stress. He denies chest pain, shortness of breath, dizziness.  He is on cholesterol medication, crestor  every other day and denies myalgias. His cholesterol is at goal. The cholesterol was:  05/26/2013: Cholesterol, Total 115; HDL Cholesterol by NMR 40; LDL (calc) 52; Triglycerides 116 He has been working on diet and exercise for Diabetes without complications, he is not on bASA, he is on ACE/ARB, and denies  paresthesia of the feet, polydipsia, polyuria and visual disturbances. Last A1C was: 05/26/2013: Hemoglobin-A1c 6.5* Patient is on Vitamin D supplement. 05/26/2013: Vit D, 25-Hydroxy 15* BMI is Body mass index is 36.46 kg/(m^2)., he is working on diet and exercise, he is seeing a nutritionist.  Wt Readings from Last 3 Encounters:  02/22/14 247 lb (112.038 kg)  05/26/13 245 lb (111.131 kg)  12/27/12 247 lb (112.038 kg)   Current Medications:  Current Outpatient Prescriptions on File Prior to Visit  Medication Sig Dispense Refill  . Cholecalciferol (VITAMIN D PO) Take 5,000 Int'l Units by mouth daily.    . INVOKAMET [phone] MG  TABS TAKE 1 TABLET BY MOUTH TWICE DAILY 60 tablet 2  . losartan (COZAAR) 100 MG tablet TAKE 1 BY MOUTH DAILY 90 tablet 0  . rosuvastatin (CRESTOR) 40 MG tablet Take 1 tablet (40 mg total) by mouth daily. 90 tablet 1   No current facility-administered medications on file prior to visit.   Medical History:  Past Medical History  Diagnosis Date  . Hyperlipidemia   . Hypertension   . Type II or unspecified type diabetes mellitus without mention of complication, not stated as uncontrolled   . Allergy   . Vitamin D deficiency   . Obesity (BMI 30-39.9)    Allergies: No Known Allergies   Review of Systems:  Review of Systems  Constitutional: Negative.   HENT: Negative.   Eyes: Negative.   Respiratory: Negative.   Cardiovascular: Negative.   Gastrointestinal: Negative.   Genitourinary: Negative.   Musculoskeletal: Negative.   Skin: Negative.   Neurological: Negative.   Endo/Heme/Allergies: Negative.   Psychiatric/Behavioral: Negative.     Family history- Review and unchanged Social history- Review and unchanged Physical Exam: BP 142/90 mmHg  Pulse 80  Temp(Src) 97.9 F (36.6 C)  Resp 16  Ht  (1.753 m)  Wt 247 lb (112.038 kg)  BMI 36.46 kg/m2 Wt Readings from Last 3 Encounters:  02/22/14 247 lb (112.038 kg)  05/26/13 245 lb (111.131 kg)  12/27/12 247 lb (112.038 kg)   General Appearance: Well nourished, in no apparent distress. Eyes: PERRLA, EOMs, conjunctiva no swelling or erythema Sinuses: No Frontal/maxillary tenderness ENT/Mouth: Ext aud canals clear, TMs without erythema, bulging. No erythema, swelling, or  exudate on post pharynx.  Tonsils not swollen or erythematous. Hearing normal.  Neck: Supple, thyroid normal.  Respiratory: Respiratory effort normal, BS equal bilaterally without rales, rhonchi, wheezing or stridor.  Cardio: RRR with no MRGs. Brisk peripheral pulses without edema.  Abdomen: Soft, + BS.  Non tender, no guarding, rebound, hernias,  masses. Lymphatics: Non tender without lymphadenopathy.  Musculoskeletal: Full ROM, 5/5 strength, normal gait.  Skin: Warm, dry without rashes, lesions, ecchymosis.  Neuro: Cranial nerves intact. No cerebellar symptoms. Sensation intact.  Psych: Awake and oriented X 3, normal affect, Insight and Judgment appropriate.    Quentin Mullingollier, Joann Kulpa, PA-C 4:17 PM [facility]

## 2014-02-23 LAB — BASIC METABOLIC PANEL WITH GFR
BUN: 13 mg/dL (ref 6–23)
CO2: 26 mEq/L (ref 19–32)
Calcium: 10 mg/dL (ref 8.4–10.5)
Chloride: 103 mEq/L (ref 96–112)
Creat: 1.01 mg/dL (ref 0.50–1.35)
GFR, Est African American: >89 mL/min
GFR, Est Non African American: >89 mL/min
Glucose, Bld: 106 mg/dL — ABNORMAL HIGH (ref 70–99)
Potassium: 4 mEq/L (ref 3.5–5.3)
Sodium: 139 mEq/L (ref 135–145)

## 2014-02-23 LAB — VITAMIN D 25 HYDROXY (VIT D DEFICIENCY, FRACTURES): Vit D, 25-Hydroxy: 34 ng/mL (ref 30–100)

## 2014-02-23 LAB — HEPATIC FUNCTION PANEL
ALT: 50 U/L (ref 0–53)
AST: 26 U/L (ref 0–37)
Albumin: 4.8 g/dL (ref 3.5–5.2)
Alkaline Phosphatase: 67 U/L (ref 39–117)
Bilirubin, Direct: 0.1 mg/dL (ref 0.0–0.3)
Indirect Bilirubin: 0.4 mg/dL (ref 0.2–1.2)
Total Bilirubin: 0.5 mg/dL (ref 0.2–1.2)
Total Protein: 7.5 g/dL (ref 6.0–8.3)

## 2014-02-23 LAB — HEMOGLOBIN A1C
Hgb A1c MFr Bld: 6.2 % — ABNORMAL HIGH (ref <5.7)
Mean Plasma Glucose: 131 mg/dL — ABNORMAL HIGH (ref <117)

## 2014-02-23 LAB — LIPID PANEL
Cholesterol: 141 mg/dL (ref 0–200)
HDL: 45 mg/dL (ref >39)
LDL Cholesterol: 68 mg/dL (ref 0–99)
Total CHOL/HDL Ratio: 3.1 Ratio
Triglycerides: 139 mg/dL (ref <150)
VLDL: 28 mg/dL (ref 0–40)

## 2014-02-23 LAB — MAGNESIUM: Magnesium: 2.2 mg/dL (ref 1.5–2.5)

## 2014-02-23 LAB — INSULIN, FASTING: Insulin fasting, serum: 83 u[IU]/mL — ABNORMAL HIGH (ref 2.0–19.6)

## 2014-02-23 LAB — TSH: TSH: 2.08 u[IU]/mL (ref 0.350–4.500)

## 2014-04-05 ENCOUNTER — Encounter: Payer: Self-pay | Admitting: *Deleted

## 2014-04-10 ENCOUNTER — Other Ambulatory Visit: Payer: Self-pay | Admitting: Internal Medicine

## 2014-05-15 ENCOUNTER — Other Ambulatory Visit: Payer: Self-pay | Admitting: Physician Assistant

## 2014-06-05 ENCOUNTER — Encounter: Payer: Self-pay | Admitting: Physician Assistant

## 2014-06-14 ENCOUNTER — Encounter: Payer: Self-pay | Admitting: Physician Assistant

## 2014-07-15 ENCOUNTER — Other Ambulatory Visit: Payer: Self-pay | Admitting: Internal Medicine

## 2014-07-19 ENCOUNTER — Other Ambulatory Visit: Payer: Self-pay | Admitting: Internal Medicine

## 2014-07-23 ENCOUNTER — Other Ambulatory Visit: Payer: Self-pay | Admitting: Internal Medicine

## 2014-07-24 ENCOUNTER — Other Ambulatory Visit: Payer: Self-pay | Admitting: *Deleted

## 2014-08-17 ENCOUNTER — Encounter: Payer: Self-pay | Admitting: Physician Assistant

## 2014-08-17 ENCOUNTER — Ambulatory Visit (INDEPENDENT_AMBULATORY_CARE_PROVIDER_SITE_OTHER): Payer: BLUE CROSS/BLUE SHIELD | Admitting: Physician Assistant

## 2014-08-17 VITALS — BP 156/100 | HR 88 | Temp 98.7°F | Resp 16 | Ht 69.0 in | Wt 256.4 lb

## 2014-08-17 DIAGNOSIS — E559 Vitamin D deficiency, unspecified: Secondary | ICD-10-CM

## 2014-08-17 DIAGNOSIS — E785 Hyperlipidemia, unspecified: Secondary | ICD-10-CM

## 2014-08-17 DIAGNOSIS — E119 Type 2 diabetes mellitus without complications: Secondary | ICD-10-CM

## 2014-08-17 DIAGNOSIS — R945 Abnormal results of liver function studies: Secondary | ICD-10-CM

## 2014-08-17 DIAGNOSIS — Z79899 Other long term (current) drug therapy: Secondary | ICD-10-CM

## 2014-08-17 DIAGNOSIS — T7840XA Allergy, unspecified, initial encounter: Secondary | ICD-10-CM

## 2014-08-17 DIAGNOSIS — Z0000 Encounter for general adult medical examination without abnormal findings: Secondary | ICD-10-CM

## 2014-08-17 DIAGNOSIS — K760 Fatty (change of) liver, not elsewhere classified: Secondary | ICD-10-CM | POA: Insufficient documentation

## 2014-08-17 DIAGNOSIS — R7989 Other specified abnormal findings of blood chemistry: Secondary | ICD-10-CM

## 2014-08-17 DIAGNOSIS — E669 Obesity, unspecified: Secondary | ICD-10-CM

## 2014-08-17 DIAGNOSIS — I10 Essential (primary) hypertension: Secondary | ICD-10-CM

## 2014-08-17 LAB — CBC WITH DIFFERENTIAL/PLATELET
Basophils Absolute: 0 10*3/uL (ref 0.0–0.1)
Basophils Relative: 1 % (ref 0–1)
Eosinophils Absolute: 0.1 10*3/uL (ref 0.0–0.7)
Eosinophils Relative: 2 % (ref 0–5)
HCT: 44.7 % (ref 39.0–52.0)
Hemoglobin: 15.5 g/dL (ref 13.0–17.0)
Lymphocytes Relative: 47 % — ABNORMAL HIGH (ref 12–46)
Lymphs Abs: 2.2 10*3/uL (ref 0.7–4.0)
MCH: 28.8 pg (ref 26.0–34.0)
MCHC: 34.7 g/dL (ref 30.0–36.0)
MCV: 83.1 fL (ref 78.0–100.0)
MPV: 10.3 fL (ref 8.6–12.4)
Monocytes Absolute: 0.4 10*3/uL (ref 0.1–1.0)
Monocytes Relative: 9 % (ref 3–12)
Neutro Abs: 1.9 10*3/uL (ref 1.7–7.7)
Neutrophils Relative %: 41 % — ABNORMAL LOW (ref 43–77)
Platelets: 323 10*3/uL (ref 150–400)
RBC: 5.38 MIL/uL (ref 4.22–5.81)
RDW: 13.8 % (ref 11.5–15.5)
WBC: 4.7 10*3/uL (ref 4.0–10.5)

## 2014-08-17 MED ORDER — LOSARTAN POTASSIUM 100 MG PO TABS: 100.0000 mg | ORAL_TABLET | Freq: Every day | ORAL | Status: DC

## 2014-08-17 NOTE — Patient Instructions (Addendum)
Recommendations For Diabetic/Prediabetic Patients:   -  Take medications as prescribed  -  Recommend Dr Francis Dowse Fuhrman's book "The End of Diabetes "  And "The End of Dieting"- Can get at  www.Amazon.com and encourage also get the Audio CD book  - AVOID Animal products, ie. Meat - red/white, Poultry and Dairy/especially cheese - Exercise at least 5 times a week for 30 minutes or preferably daily.  - No Smoking - Drink less than 2 drinks a day.  - Monitor your feet for sores - Have yearly Eye Exams - Recommend annual Flu vaccine  - Recommend Pneumovax and Prevnar vaccines - Shingles Vaccine (Zostavax) if over 66 y.o.  Goals:   - BMI less than 24 - Fasting sugar less than 130 or less than 150 if tapering medicines to lose weight  - Systolic BP less than 140  - Diastolic BP less than 80 - Bad LDL Cholesterol less than 70 - Triglycerides less than 150  Monitor your blood pressure at home, if it is above 140/90 consistently call the office so we can adjust your medications. Go to the ER if any CP, SOB, nausea, dizziness, severe HA, changes vision/speech  DASH Eating Plan DASH stands for "Dietary Approaches to Stop Hypertension." The DASH eating plan is a healthy eating plan that has been shown to reduce high blood pressure (hypertension). Additional health benefits may include reducing the risk of type 2 diabetes mellitus, heart disease, and stroke. The DASH eating plan may also help with weight loss. WHAT DO I NEED TO KNOW ABOUT THE DASH EATING PLAN? For the DASH eating plan, you will follow these general guidelines:  Choose foods with a percent daily value for sodium of less than 5% (as listed on the food label).  Use salt-free seasonings or herbs instead of table salt or sea salt.  Check with your health care provider or pharmacist before using salt substitutes.  Eat lower-sodium products, often labeled as "lower sodium" or "no salt added."  Eat fresh foods.  Eat more vegetables,  fruits, and low-fat dairy products.  Choose whole grains. Look for the word "whole" as the first word in the ingredient list.  Choose fish and skinless chicken or [place] more often than red meat. Limit fish, poultry, and meat to 6 oz (170 g) each day.  Limit sweets, desserts, sugars, and sugary drinks.  Choose heart-healthy fats.  Limit cheese to 1 oz (28 g) per day.  Eat more home-cooked food and less restaurant, buffet, and fast food.  Limit fried foods.  Cook foods using methods other than frying.  Limit canned vegetables. If you do use them, rinse them well to decrease the sodium.  When eating at a restaurant, ask that your food be prepared with less salt, or no salt if possible. WHAT FOODS CAN I EAT? Seek help from a dietitian for individual calorie needs. Grains Whole grain or whole wheat bread. Brown rice. Whole grain or whole wheat pasta. Quinoa, bulgur, and whole grain cereals. Low-sodium cereals. Corn or whole wheat flour tortillas. Whole grain cornbread. Whole grain crackers. Low-sodium crackers. Vegetables Fresh or frozen vegetables (raw, steamed, roasted, or grilled). Low-sodium or reduced-sodium tomato and vegetable juices. Low-sodium or reduced-sodium tomato sauce and paste. Low-sodium or reduced-sodium canned vegetables.  Fruits All fresh, canned (in natural juice), or frozen fruits. Meat and Other Protein Products Ground beef (85% or leaner), grass-fed beef, or beef trimmed of fat. Skinless chicken or [place]. Ground chicken or [place]. Pork trimmed of fat. All  fish and seafood. Eggs. Dried beans, peas, or lentils. Unsalted nuts and seeds. Unsalted canned beans. Dairy Low-fat dairy products, such as skim or 1% milk, 2% or reduced-fat cheeses, low-fat ricotta or cottage cheese, or plain low-fat yogurt. Low-sodium or reduced-sodium cheeses. Fats and Oils Tub margarines without trans fats. Light or reduced-fat mayonnaise and salad dressings (reduced sodium). Avocado.  Safflower, olive, or canola oils. Natural peanut or almond butter. Other Unsalted popcorn and pretzels. The items listed above may not be a complete list of recommended foods or beverages. Contact your dietitian for more options. WHAT FOODS ARE NOT RECOMMENDED? Grains White bread. White pasta. White rice. Refined cornbread. Bagels and croissants. Crackers that contain trans fat. Vegetables Creamed or fried vegetables. Vegetables in a cheese sauce. Regular canned vegetables. Regular canned tomato sauce and paste. Regular tomato and vegetable juices. Fruits Dried fruits. Canned fruit in light or heavy syrup. Fruit juice. Meat and Other Protein Products Fatty cuts of meat. Ribs, chicken wings, bacon, sausage, bologna, salami, chitterlings, fatback, hot dogs, bratwurst, and packaged luncheon meats. Salted nuts and seeds. Canned beans with salt. Dairy Whole or 2% milk, cream, half-and-half, and cream cheese. Whole-fat or sweetened yogurt. Full-fat cheeses or blue cheese. Nondairy creamers and whipped toppings. Processed cheese, cheese spreads, or cheese curds. Condiments Onion and garlic salt, seasoned salt, table salt, and sea salt. Canned and packaged gravies. Worcestershire sauce. Tartar sauce. Barbecue sauce. Teriyaki sauce. Soy sauce, including reduced sodium. Steak sauce. Fish sauce. Oyster sauce. Cocktail sauce. Horseradish. Ketchup and mustard. Meat flavorings and tenderizers. Bouillon cubes. Hot sauce. Tabasco sauce. Marinades. Taco seasonings. Relishes. Fats and Oils Butter, stick margarine, lard, shortening, ghee, and bacon fat. Coconut, palm kernel, or palm oils. Regular salad dressings. Other Pickles and olives. Salted popcorn and pretzels. The items listed above may not be a complete list of foods and beverages to avoid. Contact your dietitian for more information. WHERE CAN I FIND MORE INFORMATION? National Heart, Lung, and Blood Institute:  [employer] Document Released: 01/02/2011 Document Revised: 05/30/2013 Document Reviewed: 11/17/2012 [facility] Patient Information 2015 [place], [place]. This information is not intended to replace advice given to you by your health care provider. Make sure you discuss any questions you have with your health care provider.  Diabetes is a very complicated disease...lets simplify it.  An easy way to look at it to understand the complications is if you think of the extra sugar floating in your blood stream as glass shards floating through your blood stream.    Diabetes affects your small vessels first: 1) The glass shards (sugar) scraps down the tiny blood vessels in your eyes and lead to diabetic retinopathy, the leading cause of blindness in the [place]. Diabetes is the leading cause of newly diagnosed adult (53 to 30 years of age) blindness in the [place].  2) The glass shards scratches down the tiny vessels of your legs leading to nerve damage called neuropathy and can lead to amputations of your feet. More than 60% of all non-traumatic amputations of lower limbs occur in people with diabetes.  3) Over time the small vessels in your brain are shredded and closed off, individually this does not cause any problems but over a long period of time many of the small vessels being blocked can lead to Vascular Dementia.   4) Your kidney's are a filter system and have a "net" that keeps certain things in the body and lets bad things out. Sugar shreds this net and leads to kidney damage and  eventually failure. Decreasing the sugar that is destroying the net and certain blood pressure medications can help stop or decrease progression of kidney disease. Diabetes was the primary cause of kidney failure in 44 percent of all new cases in 2011.  5) Diabetes also destroys the small vessels in your penis that lead to erectile dysfunction. Eventually the vessels are so damaged  that you may not be responsive to cialis or viagra.   Diabetes and your large vessels: Your larger vessels consist of your coronary arteries in your heart and the carotid vessels to your brain. Diabetes or even increased sugars put you at 300% increased risk of heart attack and stroke and this is why.. The sugar scrapes down your large blood vessels and your body sees this as an internal injury and tries to repair itself. Just like you get a scab on your skin, your platelets will stick to the blood vessel wall trying to heal it. This is why we have diabetics on low dose aspirin daily, this prevents the platelets from sticking and can prevent plaque formation. In addition, your body takes cholesterol and tries to shove it into the open wound. This is why we want your LDL, or bad cholesterol, below 70.   The combination of platelets and cholesterol over 5-10 years forms plaque that can break off and cause a heart attack or stroke.   PLEASE REMEMBER:  Diabetes is preventable! Up to 85 percent of complications and morbidities among individuals with type 2 diabetes can be prevented, delayed, or effectively treated and minimized with regular visits to a health professional, appropriate monitoring and medication, and a healthy diet and lifestyle.  Before you even begin to attack a weight-loss plan, it pays to remember this: You are not fat. You have fat. Losing weight isn't about blame or shame; it's simply another achievement to accomplish. Dieting is like any other skill-you have to buckle down and work at it. As long as you act in a smart, reasonable way, you'll ultimately get where you want to be. Here are some weight loss pearls for you.  1. It's Not a Diet. It's a Lifestyle Thinking of a diet as something you're on and suffering through only for the short term doesn't work. To shed weight and keep it off, you need to make permanent changes to the way you eat. It's OK to indulge occasionally, of course,  but if you cut calories temporarily and then revert to your old way of eating, you'll gain back the weight quicker than you can say yo-yo. Use it to lose it. Research shows that one of the best predictors of long-term weight loss is how many pounds you drop in the first month. For that reason, nutritionists often suggest being stricter for the first two weeks of your new eating strategy to build momentum. Cut out added sugar and alcohol and avoid unrefined carbs. After that, figure out how you can reincorporate them in a way that's healthy and maintainable.  2. There's a Right Way to Exercise Working out burns calories and fat and boosts your metabolism by building muscle. But those trying to lose weight are notorious for overestimating the number of calories they burn and underestimating the amount they take in. Unfortunately, your system is biologically programmed to hold on to extra pounds and that means when you start exercising, your body senses the deficit and ramps up its hunger signals. If you're not diligent, you'll eat everything you burn and then some. Use it  to lose it. Cardio gets all the exercise glory, but strength and interval training are the real heroes. They help you build lean muscle, which in turn increases your metabolism and calorie-burning ability 3. Don't Overreact to Mild Hunger Some people have a hard time losing weight because of hunger anxiety. To them, being hungry is bad-something to be avoided at all costs-so they carry snacks with them and eat when they don't need to. Others eat because they're stressed out or bored. While you never want to get to the point of being ravenous (that's when bingeing is likely to happen), a hunger pang, a craving, or the fact that it's 3:00 p.m. should not send you racing for the vending machine or obsessing about the energy bar in your purse. Ideally, you should put off eating until your stomach is growling and it's difficult to concentrate.  Use  it to lose it. When you feel the urge to eat, use the HALT method. Ask yourself, Am I really hungry? Or am I angry or anxious, lonely or bored, or tired? If you're still not certain, try the apple test. If you're truly hungry, an apple should seem delicious; if it doesn't, something else is going on. Or you can try drinking water and making yourself busy, if you are still hungry try a healthy snack.  4. Not All Calories Are Created Equal The mechanics of weight loss are pretty simple: Take in fewer calories than you use for energy. But the kind of food you eat makes all the difference. Processed food that's high in saturated fat and refined starch or sugar can cause inflammation that disrupts the hormone signals that tell your brain you're full. The result: You eat a lot more.  Use it to lose it. Clean up your diet. Swap in whole, unprocessed foods, including vegetables, lean protein, and healthy fats that will fill you up and give you the biggest nutritional bang for your calorie buck. In a few weeks, as your brain starts receiving regular hunger and fullness signals once again, you'll notice that you feel less hungry overall and naturally start cutting back on the amount you eat.  5. Protein, Produce, and Plant-Based Fats Are Your Weight-Loss Trinity Here's why eating the three Ps regularly will help you drop pounds. Protein fills you up. You need it to build lean muscle, which keeps your metabolism humming so that you can torch more fat. People in a weight-loss program who ate double the recommended daily allowance for protein (about 110 grams for a 150-pound woman) lost 70 percent of their weight from fat, while people who ate the RDA lost only about 40 percent, one study found. Produce is packed with filling fiber. "It's very difficult to consume too many calories if you're eating a lot of vegetables. Example: Three cups of broccoli is a lot of food, yet only 93 calories. (Fruit is another story. It can  be easy to overeat and can contain a lot of calories from sugar, so be sure to monitor your intake.) Plant-based fats like olive oil and those in avocados and nuts are healthy and extra satiating.  Use it to lose it. Aim to incorporate each of the three Ps into every meal and snack. People who eat protein throughout the day are able to keep weight off, according to a study in the American Journal of Clinical Nutrition. In addition to meat, poultry and seafood, good sources are beans, lentils, eggs, tofu, and yogurt. As for fat, keep portion  sizes in check by measuring out salad dressing, oil, and nut butters (shoot for one to two tablespoons). Finally, eat veggies or a little fruit at every meal. People who did that consumed 308 fewer calories but didn't feel any hungrier than when they didn't eat more produce.  7. How You Eat Is As Important As What You Eat In order for your brain to register that you're full, you need to focus on what you're eating. Sit down whenever you eat, preferably at a table. Turn off the TV or computer, put down your phone, and look at your food. Smell it. Chew slowly, and don't put another bite on your fork until you swallow. When women ate lunch this attentively, they consumed 30 percent less when snacking later than those who listened to an audiobook at lunchtime, according to a study in the [place] Journal of Nutrition. 8. Weighing Yourself Really Works The scale provides the best evidence about whether your efforts are paying off. Seeing the numbers tick up or down or stagnate is motivation to keep going-or to rethink your approach. A 2015 study at [facility] found that daily weigh-ins helped people lose more weight, keep it off, and maintain that loss, even after two years. Use it to lose it. Step on the scale at the same time every day for the best results. If your weight shoots up several pounds from one weigh-in to the next, don't freak out. Eating a lot of salt the  night before or having your period is the likely culprit. The number should return to normal in a day or two. It's a steady climb that you need to do something about. 9. Too Much Stress and Too Little Sleep Are Your Enemies When you're tired and frazzled, your body cranks up the production of cortisol, the stress hormone that can cause carb cravings. Not getting enough sleep also boosts your levels of ghrelin, a hormone associated with hunger, while suppressing leptin, a hormone that signals fullness and satiety. People on a diet who slept only five and a half hours a night for two weeks lost 55 percent less fat and were hungrier than those who slept eight and a half hours, according to a study in the [place] Medical Association Journal. Use it to lose it. Prioritize sleep, aiming for seven hours or more a night, which research shows helps lower stress. And make sure you're getting quality zzz's. If a snoring spouse or a fidgety cat wakes you up frequently throughout the night, you may end up getting the equivalent of just four hours of sleep, according to a study from [facility]. Keep pets out of the bedroom, and use a white-noise app to drown out snoring. 10. You Will Hit a plateau-And You Can Bust Through It As you slim down, your body releases much less leptin, the fullness hormone.  If you're not strength training, start right now. Building muscle can raise your metabolism to help you overcome a plateau. To keep your body challenged and burning calories, incorporate new moves and more intense intervals into your workouts or add another sweat session to your weekly routine. Alternatively, cut an extra 100 calories or so a day from your diet. Now that you've lost weight, your body simply doesn't need as much fuel.

## 2014-08-17 NOTE — Progress Notes (Signed)
Complete Physical   Assessment and Plan: 1. Essential hypertension - continue medications, has not take x 1 week, get back on it, buy BP cuff and monitor at home, call if great than 140/90, DASH diet, exercise and monitor at home.  - CBC with Differential/Platelet - BASIC METABOLIC PANEL WITH GFR - Hepatic function panel - TSH - Urinalysis, Routine w reflex microscopic (not at [facility]) - Microalbumin / creatinine urine ratio  2. Hyperlipidemia -continue medications, check lipids, decrease fatty foods, increase activity.  - Lipid panel  3. Obesity Obesity with co morbidities- long discussion about weight loss, diet, and exercise  4. Diabetes mellitus without complication Discussed general issues about diabetes pathophysiology and management., Educational material distributed., Suggested low cholesterol diet., Encouraged aerobic exercise., Discussed foot care., Reminded to get yearly retinal exam. - Hemoglobin A1c - Insulin, fasting - LOW EXTREMITY NEUR EXAM DOCUM  5. Vitamin D deficiency - Vit D  25 hydroxy (rtn osteoporosis monitoring)  6. Allergy, initial encounter Continue OTC allergy pills  7. Elevated liver function tests Monitor, decreased LFTs  8. Routine general medical examination at a health care facility  9. Medication management - Magnesium   Discussed med's effects and SE's. Screening labs and tests as requested with regular follow-up as recommended.  HPI 29 y.o. male  presents for a complete physical. His blood pressure has been controlled at home, but he has been traveling and forgot his meds for 1 week, he is on losartan 100mg , today their BP is BP: (!) 156/100 mmHg, recheck 140/90 He does not workout. He denies chest pain, shortness of breath, dizziness.  He is on cholesterol medication and denies myalgias. His cholesterol is not at goal, he is on 1 crestor every other day. The cholesterol last visit was:   Lab Results  Component Value Date   CHOL 141  02/22/2014   HDL 45 02/22/2014   LDLCALC 68 02/22/2014   TRIG 139 02/22/2014   CHOLHDL 3.1 02/22/2014   Lab Results  Component Value Date   ALT 50 02/22/2014   AST 26 02/22/2014   ALKPHOS 67 02/22/2014   BILITOT 0.5 02/22/2014   He has been working on diet and exercise for diabetes without complications, he has been checking his sugars at home every few weeks, no low blood sugars, he is on invokamet BID, and denies paresthesia of the feet, polydipsia and visual disturbances. Last A1C in the office was:  Lab Results  Component Value Date   HGBA1C 6.2* 02/22/2014   Patient is not on Vitamin D supplement.  Lab Results  Component Value Date   VD25OH 34 02/22/2014   BMI is Body mass index is 37.85 kg/(m^2)., he is working on diet and exercise. Wt Readings from Last 3 Encounters:  08/17/14 256 lb 6.4 oz (116.302 kg)  02/22/14 247 lb (112.038 kg)  05/26/13 245 lb (111.131 kg)    Current Medications:  Current Outpatient Prescriptions on File Prior to Visit  Medication Sig Dispense Refill  . Canagliflozin-Metformin HCl (INVOKAMET) [phone] MG TABS Take 1 tablet by mouth 2 (two) times daily. 60 tablet 2  . Cholecalciferol (VITAMIN D PO) Take 5,000 Int'l Units by mouth daily.    . CRESTOR 40 MG tablet TAKE 1/2 TO 1 TABLET BY MOUTH ONCE DAILY 30 tablet 0  . INVOKAMET [phone] MG TABS TAKE 1 TABLET BY MOUTH TWICE DAILY 60 tablet 3  . losartan (COZAAR) 100 MG tablet Take 1 tablet (100 mg total) by mouth daily. 90 tablet 0  No current facility-administered medications on file prior to visit.   Health Maintenance:  Immunization History  Administered Date(s) Administered  . Tdap 01/28/2003, 05/26/2013   Tetanus: 2015 Pneumovax: N/A Flu vaccine: 2014 Zostavax: N/A DEXA: N/A Colonoscopy: N/A EGD: N/A Eye doctor: no eye doctor, but has appointment Dentist: No dentist  Allergies: No Known Allergies Medical History:  Past Medical History  Diagnosis Date  . Hyperlipidemia   .  Hypertension   . Type II or unspecified type diabetes mellitus without mention of complication, not stated as uncontrolled   . Allergy   . Vitamin D deficiency   . Obesity (BMI 30-39.9)    Surgical History:  Past Surgical History  Procedure Laterality Date  . Tonsillectomy  2007   Family History:  Family History  Problem Relation Age of Onset  . Diabetes Maternal Grandfather    Social History:   History  Substance Use Topics  . Smoking status: Never Smoker   . Smokeless tobacco: Never Used  . Alcohol Use: No   Review of Systems  Constitutional: Negative.   HENT: Negative.   Eyes: Negative.   Respiratory: Negative.   Cardiovascular: Negative.   Gastrointestinal: Negative.   Genitourinary: Negative.   Musculoskeletal: Negative.   Skin: Negative.   Neurological: Negative.   Endo/Heme/Allergies: Negative.   Psychiatric/Behavioral: Negative.    Physical Exam: Estimated body mass index is 37.85 kg/(m^2) as calculated from the following:   Height as of this encounter:  (1.753 m).   Weight as of this encounter: 256 lb 6.4 oz (116.302 kg). BP 156/100 mmHg  Pulse 88  Temp(Src) 98.7 F (37.1 C)  Resp 16  Ht  (1.753 m)  Wt 256 lb 6.4 oz (116.302 kg)  BMI 37.85 kg/m2 General Appearance: Well nourished, in no apparent distress. Eyes: PERRLA, EOMs, conjunctiva no swelling or erythema, normal fundi and vessels. Sinuses: No Frontal/maxillary tenderness ENT/Mouth: Ext aud canals clear, normal light reflex with TMs without erythema, bulging. Good dentition. No erythema, swelling, or exudate on post pharynx. Tonsils not swollen or erythematous. Hearing normal.  Neck: Supple, thyroid normal. No bruits Respiratory: Respiratory effort normal, BS equal bilaterally without rales, rhonchi, wheezing or stridor. Cardio: RRR without murmurs, rubs or gallops. Brisk peripheral pulses without edema.  Chest: symmetric, with normal excursions and percussion. Abdomen: Soft, +BS. Non  tender, no guarding, rebound, hernias, masses, or organomegaly. .  Lymphatics: Non tender without lymphadenopathy.  Genitourinary: defer Musculoskeletal: Full ROM all peripheral extremities,5/5 strength, and normal gait. Skin: Warm, dry without rashes, lesions, ecchymosis.  Neuro: Cranial nerves intact, reflexes equal bilaterally. Normal muscle tone, no cerebellar symptoms. Sensation intact.  Psych: Awake and oriented X 3, normal affect, Insight and Judgment appropriate.   EKG: declines  [name] 4:14 PM

## 2014-08-18 LAB — HEPATIC FUNCTION PANEL
ALT: 70 U/L — ABNORMAL HIGH (ref 0–53)
AST: 36 U/L (ref 0–37)
Albumin: 4.9 g/dL (ref 3.5–5.2)
Alkaline Phosphatase: 68 U/L (ref 39–117)
Bilirubin, Direct: 0.1 mg/dL (ref 0.0–0.3)
Indirect Bilirubin: 0.3 mg/dL (ref 0.2–1.2)
Total Bilirubin: 0.4 mg/dL (ref 0.2–1.2)
Total Protein: 7.6 g/dL (ref 6.0–8.3)

## 2014-08-18 LAB — INSULIN, FASTING: Insulin fasting, serum: 30.8 u[IU]/mL — ABNORMAL HIGH (ref 2.0–19.6)

## 2014-08-18 LAB — URINALYSIS, ROUTINE W REFLEX MICROSCOPIC
Bilirubin Urine: NEGATIVE
Glucose, UA: >1000 mg/dL — AB
Hgb urine dipstick: NEGATIVE
Ketones, ur: NEGATIVE mg/dL
Leukocytes, UA: NEGATIVE
Nitrite: NEGATIVE
Protein, ur: NEGATIVE mg/dL
Specific Gravity, Urine: >1.03 — ABNORMAL HIGH (ref 1.005–1.030)
Urobilinogen, UA: 0.2 mg/dL (ref 0.0–1.0)
pH: 5.5 (ref 5.0–8.0)

## 2014-08-18 LAB — MAGNESIUM: Magnesium: 2.1 mg/dL (ref 1.5–2.5)

## 2014-08-18 LAB — LIPID PANEL
Cholesterol: 153 mg/dL (ref 0–200)
HDL: 51 mg/dL (ref >=40)
LDL Cholesterol: 67 mg/dL (ref 0–99)
Total CHOL/HDL Ratio: 3 Ratio
Triglycerides: 173 mg/dL — ABNORMAL HIGH (ref <150)
VLDL: 35 mg/dL (ref 0–40)

## 2014-08-18 LAB — URINALYSIS, MICROSCOPIC ONLY
Bacteria, UA: NONE SEEN
Casts: NONE SEEN
Crystals: NONE SEEN
Squamous Epithelial / LPF: NONE SEEN

## 2014-08-18 LAB — BASIC METABOLIC PANEL WITH GFR
BUN: 13 mg/dL (ref 6–23)
CO2: 26 mEq/L (ref 19–32)
Calcium: 10.2 mg/dL (ref 8.4–10.5)
Chloride: 102 mEq/L (ref 96–112)
Creat: 1.07 mg/dL (ref 0.50–1.35)
GFR, Est African American: >89 mL/min
GFR, Est Non African American: >89 mL/min
Glucose, Bld: 91 mg/dL (ref 70–99)
Potassium: 4.1 mEq/L (ref 3.5–5.3)
Sodium: 139 mEq/L (ref 135–145)

## 2014-08-18 LAB — MICROALBUMIN / CREATININE URINE RATIO
Creatinine, Urine: 127.5 mg/dL
Microalb Creat Ratio: 8.6 mg/g (ref 0.0–30.0)
Microalb, Ur: 1.1 mg/dL (ref <2.0)

## 2014-08-18 LAB — HEMOGLOBIN A1C
Hgb A1c MFr Bld: 6.6 % — ABNORMAL HIGH (ref <5.7)
Mean Plasma Glucose: 143 mg/dL — ABNORMAL HIGH (ref <117)

## 2014-08-18 LAB — VITAMIN D 25 HYDROXY (VIT D DEFICIENCY, FRACTURES): Vit D, 25-Hydroxy: 17 ng/mL — ABNORMAL LOW (ref 30–100)

## 2014-08-18 LAB — TSH: TSH: 2.107 u[IU]/mL (ref 0.350–4.500)

## 2014-09-11 ENCOUNTER — Other Ambulatory Visit: Payer: Self-pay | Admitting: Physician Assistant

## 2014-10-23 ENCOUNTER — Other Ambulatory Visit: Payer: Self-pay

## 2014-10-24 ENCOUNTER — Other Ambulatory Visit: Payer: Self-pay | Admitting: Physician Assistant

## 2014-10-24 ENCOUNTER — Other Ambulatory Visit: Payer: Self-pay | Admitting: *Deleted

## 2014-10-24 MED ORDER — METFORMIN HCL ER 500 MG PO TB24: ORAL_TABLET | ORAL | Status: DC

## 2014-10-24 MED ORDER — CANAGLIFLOZIN-METFORMIN HCL 150-1000 MG PO TABS: 1.0000 | ORAL_TABLET | Freq: Two times a day (BID) | ORAL | Status: DC

## 2014-10-24 MED ORDER — ATORVASTATIN CALCIUM 80 MG PO TABS: 80.0000 mg | ORAL_TABLET | Freq: Every day | ORAL | Status: DC

## 2014-10-24 NOTE — Telephone Encounter (Signed)
Samples of Invokamet 150 mg/1000 mg placed up front for patient to pick up.  Patient aware.

## 2014-11-20 ENCOUNTER — Other Ambulatory Visit: Payer: Self-pay | Admitting: Internal Medicine

## 2014-11-20 DIAGNOSIS — E119 Type 2 diabetes mellitus without complications: Secondary | ICD-10-CM

## 2014-11-20 DIAGNOSIS — I10 Essential (primary) hypertension: Secondary | ICD-10-CM

## 2014-11-20 MED ORDER — LOSARTAN POTASSIUM 100 MG PO TABS: 100.0000 mg | ORAL_TABLET | Freq: Every day | ORAL | Status: DC

## 2014-11-20 MED ORDER — METFORMIN HCL ER 500 MG PO TB24: ORAL_TABLET | ORAL | Status: DC

## 2014-11-23 ENCOUNTER — Ambulatory Visit: Payer: Self-pay | Admitting: Physician Assistant

## 2014-12-27 ENCOUNTER — Other Ambulatory Visit: Payer: Self-pay | Admitting: Internal Medicine

## 2015-01-16 ENCOUNTER — Ambulatory Visit (INDEPENDENT_AMBULATORY_CARE_PROVIDER_SITE_OTHER): Payer: BLUE CROSS/BLUE SHIELD | Admitting: Physician Assistant

## 2015-01-16 ENCOUNTER — Encounter: Payer: Self-pay | Admitting: Physician Assistant

## 2015-01-16 VITALS — BP 144/100 | HR 84 | Temp 97.3°F | Resp 16 | Ht 69.0 in | Wt 266.6 lb

## 2015-01-16 DIAGNOSIS — E785 Hyperlipidemia, unspecified: Secondary | ICD-10-CM | POA: Diagnosis not present

## 2015-01-16 DIAGNOSIS — E559 Vitamin D deficiency, unspecified: Secondary | ICD-10-CM

## 2015-01-16 DIAGNOSIS — R945 Abnormal results of liver function studies: Secondary | ICD-10-CM

## 2015-01-16 DIAGNOSIS — I10 Essential (primary) hypertension: Secondary | ICD-10-CM

## 2015-01-16 DIAGNOSIS — R7989 Other specified abnormal findings of blood chemistry: Secondary | ICD-10-CM

## 2015-01-16 DIAGNOSIS — Z79899 Other long term (current) drug therapy: Secondary | ICD-10-CM | POA: Diagnosis not present

## 2015-01-16 DIAGNOSIS — E119 Type 2 diabetes mellitus without complications: Secondary | ICD-10-CM

## 2015-01-16 DIAGNOSIS — R7309 Other abnormal glucose: Secondary | ICD-10-CM | POA: Diagnosis not present

## 2015-01-16 DIAGNOSIS — E782 Mixed hyperlipidemia: Secondary | ICD-10-CM | POA: Diagnosis not present

## 2015-01-16 DIAGNOSIS — E669 Obesity, unspecified: Secondary | ICD-10-CM

## 2015-01-16 LAB — CBC WITH DIFFERENTIAL/PLATELET
Basophils Absolute: 0 10*3/uL (ref 0.0–0.1)
Basophils Relative: 0 % (ref 0–1)
Eosinophils Absolute: 0.1 10*3/uL (ref 0.0–0.7)
Eosinophils Relative: 2 % (ref 0–5)
HCT: 41.9 % (ref 39.0–52.0)
Hemoglobin: 14.3 g/dL (ref 13.0–17.0)
Lymphocytes Relative: 46 % (ref 12–46)
Lymphs Abs: 2.2 10*3/uL (ref 0.7–4.0)
MCH: 28.8 pg (ref 26.0–34.0)
MCHC: 34.1 g/dL (ref 30.0–36.0)
MCV: 84.3 fL (ref 78.0–100.0)
MPV: 10.5 fL (ref 8.6–12.4)
Monocytes Absolute: 0.4 10*3/uL (ref 0.1–1.0)
Monocytes Relative: 8 % (ref 3–12)
Neutro Abs: 2.1 10*3/uL (ref 1.7–7.7)
Neutrophils Relative %: 44 % (ref 43–77)
Platelets: 272 10*3/uL (ref 150–400)
RBC: 4.97 MIL/uL (ref 4.22–5.81)
RDW: 13.7 % (ref 11.5–15.5)
WBC: 4.7 10*3/uL (ref 4.0–10.5)

## 2015-01-16 LAB — BASIC METABOLIC PANEL WITH GFR
BUN: 10 mg/dL (ref 7–25)
CO2: 26 mmol/L (ref 20–31)
Calcium: 9.8 mg/dL (ref 8.6–10.3)
Chloride: 103 mmol/L (ref 98–110)
Creat: 0.92 mg/dL (ref 0.60–1.35)
GFR, Est African American: >89 mL/min (ref >=60)
GFR, Est Non African American: >89 mL/min (ref >=60)
Glucose, Bld: 132 mg/dL — ABNORMAL HIGH (ref 65–99)
Potassium: 4.1 mmol/L (ref 3.5–5.3)
Sodium: 139 mmol/L (ref 135–146)

## 2015-01-16 LAB — HEPATIC FUNCTION PANEL
ALT: 97 U/L — ABNORMAL HIGH (ref 9–46)
AST: 49 U/L — ABNORMAL HIGH (ref 10–40)
Albumin: 4.7 g/dL (ref 3.6–5.1)
Alkaline Phosphatase: 67 U/L (ref 40–115)
Bilirubin, Direct: 0.1 mg/dL (ref <=0.2)
Indirect Bilirubin: 0.3 mg/dL (ref 0.2–1.2)
Total Bilirubin: 0.4 mg/dL (ref 0.2–1.2)
Total Protein: 7.2 g/dL (ref 6.1–8.1)

## 2015-01-16 LAB — LIPID PANEL
Cholesterol: 142 mg/dL (ref 125–200)
HDL: 41 mg/dL (ref >=40)
LDL Cholesterol: 64 mg/dL (ref <130)
Total CHOL/HDL Ratio: 3.5 Ratio (ref <=5.0)
Triglycerides: 186 mg/dL — ABNORMAL HIGH (ref <150)
VLDL: 37 mg/dL — ABNORMAL HIGH (ref <30)

## 2015-01-16 LAB — MAGNESIUM: Magnesium: 1.8 mg/dL (ref 1.5–2.5)

## 2015-01-16 MED ORDER — ATORVASTATIN CALCIUM 80 MG PO TABS: 80.0000 mg | ORAL_TABLET | Freq: Every day | ORAL | Status: DC

## 2015-01-16 MED ORDER — METFORMIN HCL ER 500 MG PO TB24: ORAL_TABLET | ORAL | Status: DC

## 2015-01-16 MED ORDER — DAPAGLIFLOZIN PROPANEDIOL 10 MG PO TABS: 10.0000 mg | ORAL_TABLET | Freq: Every day | ORAL | Status: DC

## 2015-01-16 MED ORDER — LOSARTAN POTASSIUM 100 MG PO TABS: 100.0000 mg | ORAL_TABLET | Freq: Every day | ORAL | Status: DC

## 2015-01-16 NOTE — Addendum Note (Signed)
Addended by: Quentin MullingOLLIER, Chika Cichowski R on: 01/16/2015 05:08 PM   Modules accepted: Orders

## 2015-01-16 NOTE — Progress Notes (Signed)
Assessment and Plan:  Hypertension: Continue medication, will add on farxiga, monitor blood pressure at home. Continue DASH diet.  Reminder to go to the ER if any CP, SOB, nausea, dizziness, severe HA, changes vision/speech, left arm numbness and tingling and jaw pain. Cholesterol: Continue diet and exercise. Check cholesterol.  Diabetes without complications-Continue diet and exercise. Check A1C, will add on farxiga 10mg  daily.  Vitamin D Def- check level and continue medications.  Obesity with co morbidities- long discussion about weight loss, diet, and exercise  Continue diet and meds as discussed. Further disposition pending results of labs. Discussed med's effects and SE's.    HPI 29 y.o. male  presents for 3 month follow up with hypertension, hyperlipidemia, diabetes and vitamin D. His blood pressure is not checked at home , losartan 100mg , today their BP is BP: (!) 144/100 mmHg, recheck 140/90.  He does not workout, due to stress. He denies chest pain, shortness of breath, dizziness.  He is on cholesterol medication, atorvastatin 80 every other day and denies myalgias. His cholesterol is at goal. The cholesterol was:  08/17/2014: Cholesterol 153; HDL 51; LDL Cholesterol 67; Triglycerides 173* He has been working on diet and exercise for Diabetes without complications, he is not on bASA, he is on ACE/ARB, and denies  paresthesia of the feet, polydipsia, polyuria and visual disturbances. Last A1C was: 08/17/2014: Hgb A1c MFr Bld 6.6*  Lab Results  Component Value Date   GFRAA >89 08/17/2014  Patient is on Vitamin D supplement. 08/17/2014: Vit D, 25-Hydroxy 17* BMI is Body mass index is 39.35 kg/(m^2)., he is working on diet and exercise.  Wt Readings from Last 3 Encounters:  01/16/15 266 lb 9.6 oz (120.929 kg)  08/17/14 256 lb 6.4 oz (116.302 kg)  02/22/14 247 lb (112.038 kg)   Current Medications:  Current Outpatient Prescriptions on File Prior to Visit  Medication Sig Dispense Refill   . Cholecalciferol (VITAMIN D PO) Take 5,000 Int'l Units by mouth daily.    Marland Kitchen. losartan (COZAAR) 100 MG tablet Take 1 tablet (100 mg total) by mouth daily. 90 tablet 0  . metFORMIN (GLUCOPHAGE-XR) 500 MG 24 hr tablet TAKE 1 TO 2 TABLETS 2 X DAY FOR DIABETES (Patient taking differently: TAKE 2 TABLETS 2 X DAY FOR DIABETES) 60 tablet 0  . atorvastatin (LIPITOR) 80 MG tablet Take 1 tablet (80 mg total) by mouth daily. (Patient not taking: Reported on 01/16/2015) 30 tablet 11  . Canagliflozin-Metformin HCl (INVOKAMET) [id number] MG TABS Take 1 tablet by mouth 2 (two) times daily. (Patient not taking: Reported on 01/16/2015) 60 tablet 2  . Canagliflozin-Metformin HCl (INVOKAMET) [id number] MG TABS Take 1 tablet by mouth 2 (two) times daily. (Patient not taking: Reported on 01/16/2015) 30 tablet 0  . CRESTOR 40 MG tablet TAKE 1/2 TO 1 TABLET BY MOUTH ONCE DAILY (Patient not taking: Reported on 01/16/2015) 30 tablet 2   No current facility-administered medications on file prior to visit.   Medical History:  Past Medical History  Diagnosis Date  . Hyperlipidemia   . Hypertension   . Type II or unspecified type diabetes mellitus without mention of complication, not stated as uncontrolled   . Allergy   . Vitamin D deficiency   . Obesity (BMI 30-39.9)    Allergies: No Known Allergies   Review of Systems:  Review of Systems  Constitutional: Negative.   HENT: Negative.   Eyes: Negative.   Respiratory: Negative.   Cardiovascular: Negative.   Gastrointestinal: Negative.   Genitourinary: Negative.  Musculoskeletal: Negative.   Skin: Negative.   Neurological: Negative.   Endo/Heme/Allergies: Negative.   Psychiatric/Behavioral: Negative.     Family history- Review and unchanged Social history- Review and unchanged Physical Exam: BP 144/100 mmHg  Pulse 84  Temp(Src) 97.3 F (36.3 C)  Resp 16  Ht  (1.753 m)  Wt 266 lb 9.6 oz (120.929 kg)  BMI 39.35 kg/m2 Wt Readings from Last 3  Encounters:  01/16/15 266 lb 9.6 oz (120.929 kg)  08/17/14 256 lb 6.4 oz (116.302 kg)  02/22/14 247 lb (112.038 kg)   General Appearance: Well nourished, in no apparent distress. Eyes: PERRLA, EOMs, conjunctiva no swelling or erythema Sinuses: No Frontal/maxillary tenderness ENT/Mouth: Ext aud canals clear, TMs without erythema, bulging. No erythema, swelling, or exudate on post pharynx.  Tonsils not swollen or erythematous. Hearing normal.  Neck: Supple, thyroid normal.  Respiratory: Respiratory effort normal, BS equal bilaterally without rales, rhonchi, wheezing or stridor.  Cardio: RRR with no MRGs. Brisk peripheral pulses without edema.  Abdomen: Soft, + BS.  Non tender, no guarding, rebound, hernias, masses. Lymphatics: Non tender without lymphadenopathy.  Musculoskeletal: Full ROM, 5/5 strength, normal gait.  Skin: Warm, dry without rashes, lesions, ecchymosis.  Neuro: Cranial nerves intact. No cerebellar symptoms. Sensation intact.  Psych: Awake and oriented X 3, normal affect, Insight and Judgment appropriate.    Louis Mulling, PA-C 4:47 PM [facility]

## 2015-01-16 NOTE — Patient Instructions (Addendum)
Add ENTERIC COATED low dose 81 mg Aspirin daily OR can do every other day if you have easy bruising to protect your heart and head. As well as to reduce risk of Colon Cancer by 20 %, Skin Cancer by 26 % , Melanoma by 46% and Pancreatic cancer by 60%  Go back to lipitor 80, 1/2 pill every other day.   Will add on farxiga 10mg  daily  Diabetes is a very complicated disease...lets simplify it.  An easy way to look at it to understand the complications is if you think of the extra sugar floating in your blood stream as glass shards floating through your blood stream.    Diabetes affects your small vessels first: 1) The glass shards (sugar) scraps down the tiny blood vessels in your eyes and lead to diabetic retinopathy, the leading cause of blindness in the [place]. Diabetes is the leading cause of newly diagnosed adult (3920 to 30 years of age) blindness in the [place].  2) The glass shards scratches down the tiny vessels of your legs leading to nerve damage called neuropathy and can lead to amputations of your feet. More than 60% of all non-traumatic amputations of lower limbs occur in people with diabetes.  3) Over time the small vessels in your brain are shredded and closed off, individually this does not cause any problems but over a long period of time many of the small vessels being blocked can lead to Vascular Dementia.   4) Your kidney's are a filter system and have a "net" that keeps certain things in the body and lets bad things out. Sugar shreds this net and leads to kidney damage and eventually failure. Decreasing the sugar that is destroying the net and certain blood pressure medications can help stop or decrease progression of kidney disease. Diabetes was the primary cause of kidney failure in 44 percent of all new cases in 2011.  5) Diabetes also destroys the small vessels in your penis that lead to erectile dysfunction. Eventually the vessels are so damaged that you may not be  responsive to cialis or viagra.   Diabetes and your large vessels: Your larger vessels consist of your coronary arteries in your heart and the carotid vessels to your brain. Diabetes or even increased sugars put you at 300% increased risk of heart attack and stroke and this is why.. The sugar scrapes down your large blood vessels and your body sees this as an internal injury and tries to repair itself. Just like you get a scab on your skin, your platelets will stick to the blood vessel wall trying to heal it. This is why we have diabetics on low dose aspirin daily, this prevents the platelets from sticking and can prevent plaque formation. In addition, your body takes cholesterol and tries to shove it into the open wound. This is why we want your LDL, or bad cholesterol, below 70.   The combination of platelets and cholesterol over 5-10 years forms plaque that can break off and cause a heart attack or stroke.   PLEASE REMEMBER:  Diabetes is preventable! Up to 85 percent of complications and morbidities among individuals with type 2 diabetes can be prevented, delayed, or effectively treated and minimized with regular visits to a health professional, appropriate monitoring and medication, and a healthy diet and lifestyle.  We want weight loss that will last so you should lose 1-2 pounds a week.  THAT IS IT! Please pick THREE things a month to change. Once  it is a habit check off the item. Then pick another three items off the list to become habits.  If you are already doing a habit on the list GREAT!  Cross that item off! o Don't drink your calories. Ie, alcohol, soda, fruit juice, and sweet tea.  o Drink more water. Drink a glass when you feel hungry or before each meal.  o Eat breakfast - Complex carb and protein (likeDannon light and fit yogurt, oatmeal, fruit, eggs, [place] bacon). o Measure your cereal.  Eat no more than one cup a day. (ie [place]) o Eat an apple a day. o Add a vegetable a  day. o Try a new vegetable a month. o Use Pam! Stop using oil or butter to cook. o Don't finish your plate or use smaller plates. o Share your dessert. o Eat sugar free Jello for dessert or frozen grapes. o Don't eat 2-3 hours before bed. o Switch to whole wheat bread, pasta, and brown rice. o Make healthier choices when you eat out. No fries! o Pick baked chicken, NOT fried. o Don't forget to SLOW DOWN when you eat. It is not going anywhere.  o Take the stairs. o Park far away in the parking lot o [employer] (or weights) for 10 minutes while watching TV. o Walk at work for 10 minutes during break. o Walk outside 1 time a week with your friend, kids, dog, or significant other. o Start a walking group at church. o Walk the mall as much as you can tolerate.  o Keep a food diary. o Weigh yourself daily. o Walk for 15 minutes 3 days per week. o Cook at home more often and eat out less.  If life happens and you go back to old habits, it is okay.  Just start over. You can do it!   If you experience chest pain, get short of breath, or tired during the exercise, please stop immediately and inform your doctor.   Before you even begin to attack a weight-loss plan, it pays to remember this: You are not fat. You have fat. Losing weight isn't about blame or shame; it's simply another achievement to accomplish. Dieting is like any other skill-you have to buckle down and work at it. As long as you act in a smart, reasonable way, you'll ultimately get where you want to be. Here are some weight loss pearls for you.  1. It's Not a Diet. It's a Lifestyle Thinking of a diet as something you're on and suffering through only for the short term doesn't work. To shed weight and keep it off, you need to make permanent changes to the way you eat. It's OK to indulge occasionally, of course, but if you cut calories temporarily and then revert to your old way of eating, you'll gain back the weight quicker than  you can say yo-yo. Use it to lose it. Research shows that one of the best predictors of long-term weight loss is how many pounds you drop in the first month. For that reason, nutritionists often suggest being stricter for the first two weeks of your new eating strategy to build momentum. Cut out added sugar and alcohol and avoid unrefined carbs. After that, figure out how you can reincorporate them in a way that's healthy and maintainable.  2. There's a Right Way to Exercise Working out burns calories and fat and boosts your metabolism by building muscle. But those trying to lose weight are notorious for overestimating the  number of calories they burn and underestimating the amount they take in. Unfortunately, your system is biologically programmed to hold on to extra pounds and that means when you start exercising, your body senses the deficit and ramps up its hunger signals. If you're not diligent, you'll eat everything you burn and then some. Use it to lose it. Cardio gets all the exercise glory, but strength and interval training are the real heroes. They help you build lean muscle, which in turn increases your metabolism and calorie-burning ability 3. Don't Overreact to Mild Hunger Some people have a hard time losing weight because of hunger anxiety. To them, being hungry is bad-something to be avoided at all costs-so they carry snacks with them and eat when they don't need to. Others eat because they're stressed out or bored. While you never want to get to the point of being ravenous (that's when bingeing is likely to happen), a hunger pang, a craving, or the fact that it's 3:00 p.m. should not send you racing for the vending machine or obsessing about the energy bar in your purse. Ideally, you should put off eating until your stomach is growling and it's difficult to concentrate.  Use it to lose it. When you feel the urge to eat, use the HALT method. Ask yourself, Am I really hungry? Or am I angry or  anxious, lonely or bored, or tired? If you're still not certain, try the apple test. If you're truly hungry, an apple should seem delicious; if it doesn't, something else is going on. Or you can try drinking water and making yourself busy, if you are still hungry try a healthy snack.  4. Not All Calories Are Created Equal The mechanics of weight loss are pretty simple: Take in fewer calories than you use for energy. But the kind of food you eat makes all the difference. Processed food that's high in saturated fat and refined starch or sugar can cause inflammation that disrupts the hormone signals that tell your brain you're full. The result: You eat a lot more.  Use it to lose it. Clean up your diet. Swap in whole, unprocessed foods, including vegetables, lean protein, and healthy fats that will fill you up and give you the biggest nutritional bang for your calorie buck. In a few weeks, as your brain starts receiving regular hunger and fullness signals once again, you'll notice that you feel less hungry overall and naturally start cutting back on the amount you eat.  5. Protein, Produce, and Plant-Based Fats Are Your Weight-Loss Trinity Here's why eating the three Ps regularly will help you drop pounds. Protein fills you up. You need it to build lean muscle, which keeps your metabolism humming so that you can torch more fat. People in a weight-loss program who ate double the recommended daily allowance for protein (about 110 grams for a 150-pound woman) lost 70 percent of their weight from fat, while people who ate the RDA lost only about 40 percent, one study found. Produce is packed with filling fiber. "It's very difficult to consume too many calories if you're eating a lot of vegetables. Example: Three cups of broccoli is a lot of food, yet only 93 calories. (Fruit is another story. It can be easy to overeat and can contain a lot of calories from sugar, so be sure to monitor your intake.) Plant-based  fats like olive oil and those in avocados and nuts are healthy and extra satiating.  Use it to lose it. Aim to  incorporate each of the three Ps into every meal and snack. People who eat protein throughout the day are able to keep weight off, according to a study in the American Journal of Clinical Nutrition. In addition to meat, poultry and seafood, good sources are beans, lentils, eggs, tofu, and yogurt. As for fat, keep portion sizes in check by measuring out salad dressing, oil, and nut butters (shoot for one to two tablespoons). Finally, eat veggies or a little fruit at every meal. People who did that consumed 308 fewer calories but didn't feel any hungrier than when they didn't eat more produce.  7. How You Eat Is As Important As What You Eat In order for your brain to register that you're full, you need to focus on what you're eating. Sit down whenever you eat, preferably at a table. Turn off the TV or computer, put down your phone, and look at your food. Smell it. Chew slowly, and don't put another bite on your fork until you swallow. When women ate lunch this attentively, they consumed 30 percent less when snacking later than those who listened to an audiobook at lunchtime, according to a study in the [place] Journal of Nutrition. 8. Weighing Yourself Really Works The scale provides the best evidence about whether your efforts are paying off. Seeing the numbers tick up or down or stagnate is motivation to keep going-or to rethink your approach. A 2015 study at [place] found that daily weigh-ins helped people lose more weight, keep it off, and maintain that loss, even after two years. Use it to lose it. Step on the scale at the same time every day for the best results. If your weight shoots up several pounds from one weigh-in to the next, don't freak out. Eating a lot of salt the night before or having your period is the likely culprit. The number should return to normal in a day or two. It's  a steady climb that you need to do something about. 9. Too Much Stress and Too Little Sleep Are Your Enemies When you're tired and frazzled, your body cranks up the production of cortisol, the stress hormone that can cause carb cravings. Not getting enough sleep also boosts your levels of ghrelin, a hormone associated with hunger, while suppressing leptin, a hormone that signals fullness and satiety. People on a diet who slept only five and a half hours a night for two weeks lost 55 percent less fat and were hungrier than those who slept eight and a half hours, according to a study in the [place] [employer] Journal. Use it to lose it. Prioritize sleep, aiming for seven hours or more a night, which research shows helps lower stress. And make sure you're getting quality zzz's. If a snoring spouse or a fidgety cat wakes you up frequently throughout the night, you may end up getting the equivalent of just four hours of sleep, according to a study from [facility]. Keep pets out of the bedroom, and use a white-noise app to drown out snoring. 10. You Will Hit a plateau-And You Can Bust Through It As you slim down, your body releases much less leptin, the fullness hormone.  If you're not strength training, start right now. Building muscle can raise your metabolism to help you overcome a plateau. To keep your body challenged and burning calories, incorporate new moves and more intense intervals into your workouts or add another sweat session to your weekly routine. Alternatively, cut an extra 100 calories  or so a day from your diet. Now that you've lost weight, your body simply doesn't need as much fuel.

## 2015-01-17 LAB — TSH: TSH: 2.148 u[IU]/mL (ref 0.350–4.500)

## 2015-01-17 LAB — VITAMIN D 25 HYDROXY (VIT D DEFICIENCY, FRACTURES): Vit D, 25-Hydroxy: 11 ng/mL — ABNORMAL LOW (ref 30–100)

## 2015-01-17 LAB — HEMOGLOBIN A1C
Hgb A1c MFr Bld: 7 % — ABNORMAL HIGH (ref <5.7)
Mean Plasma Glucose: 154 mg/dL — ABNORMAL HIGH (ref <117)

## 2015-01-17 LAB — INSULIN, FASTING: Insulin fasting, serum: 94.9 u[IU]/mL — ABNORMAL HIGH (ref 2.0–19.6)

## 2015-03-07 ENCOUNTER — Other Ambulatory Visit: Payer: Self-pay

## 2015-03-07 DIAGNOSIS — I10 Essential (primary) hypertension: Secondary | ICD-10-CM

## 2015-03-07 MED ORDER — DAPAGLIFLOZIN PROPANEDIOL 10 MG PO TABS: 10.0000 mg | ORAL_TABLET | Freq: Every day | ORAL | Status: DC

## 2015-03-07 MED ORDER — METFORMIN HCL ER 500 MG PO TB24: ORAL_TABLET | ORAL | Status: DC

## 2015-03-07 MED ORDER — LOSARTAN POTASSIUM 100 MG PO TABS: 100.0000 mg | ORAL_TABLET | Freq: Every day | ORAL | Status: DC

## 2015-03-07 MED ORDER — ATORVASTATIN CALCIUM 80 MG PO TABS: 80.0000 mg | ORAL_TABLET | Freq: Every day | ORAL | Status: DC

## 2015-03-08 NOTE — Telephone Encounter (Signed)
Pharmacy was called on 4 Rxs were cancelled.

## 2015-03-19 ENCOUNTER — Other Ambulatory Visit: Payer: Self-pay | Admitting: Physician Assistant

## 2015-03-19 MED ORDER — CANAGLIFLOZIN-METFORMIN HCL 150-1000 MG PO TABS: ORAL_TABLET | ORAL | Status: DC

## 2015-04-14 ENCOUNTER — Other Ambulatory Visit: Payer: Self-pay | Admitting: Physician Assistant

## 2015-04-19 ENCOUNTER — Ambulatory Visit: Payer: Self-pay | Admitting: Physician Assistant

## 2015-06-14 ENCOUNTER — Other Ambulatory Visit: Payer: Self-pay

## 2015-06-14 MED ORDER — ATORVASTATIN CALCIUM 80 MG PO TABS: 80.0000 mg | ORAL_TABLET | Freq: Every day | ORAL | Status: DC

## 2015-07-17 DIAGNOSIS — L600 Ingrowing nail: Secondary | ICD-10-CM | POA: Diagnosis not present

## 2015-07-19 ENCOUNTER — Ambulatory Visit: Payer: Self-pay | Admitting: Physician Assistant

## 2015-08-23 ENCOUNTER — Encounter: Payer: Self-pay | Admitting: Physician Assistant

## 2015-09-15 ENCOUNTER — Other Ambulatory Visit: Payer: Self-pay | Admitting: Physician Assistant

## 2015-09-15 DIAGNOSIS — I10 Essential (primary) hypertension: Secondary | ICD-10-CM

## 2015-09-27 ENCOUNTER — Ambulatory Visit: Payer: Self-pay | Admitting: Physician Assistant

## 2015-10-06 DIAGNOSIS — S83412A Sprain of medial collateral ligament of left knee, initial encounter: Secondary | ICD-10-CM | POA: Diagnosis not present

## 2015-10-19 ENCOUNTER — Other Ambulatory Visit: Payer: Self-pay | Admitting: Physician Assistant

## 2015-10-19 DIAGNOSIS — I10 Essential (primary) hypertension: Secondary | ICD-10-CM

## 2015-10-24 ENCOUNTER — Other Ambulatory Visit: Payer: Self-pay

## 2015-10-24 DIAGNOSIS — I10 Essential (primary) hypertension: Secondary | ICD-10-CM

## 2015-10-24 MED ORDER — LOSARTAN POTASSIUM 100 MG PO TABS: 100.0000 mg | ORAL_TABLET | Freq: Every day | ORAL | 1 refills | Status: DC

## 2015-11-22 ENCOUNTER — Encounter: Payer: Self-pay | Admitting: Physician Assistant

## 2015-12-03 ENCOUNTER — Other Ambulatory Visit: Payer: Self-pay

## 2015-12-03 MED ORDER — CANAGLIFLOZIN-METFORMIN HCL 150-1000 MG PO TABS: ORAL_TABLET | ORAL | 0 refills | Status: DC

## 2015-12-31 ENCOUNTER — Ambulatory Visit: Payer: BLUE CROSS/BLUE SHIELD | Admitting: Physician Assistant

## 2015-12-31 ENCOUNTER — Encounter: Payer: Self-pay | Admitting: Physician Assistant

## 2015-12-31 VITALS — BP 130/80 | HR 60 | Temp 97.5°F | Resp 16 | Ht 69.0 in | Wt 244.6 lb

## 2015-12-31 DIAGNOSIS — Z0001 Encounter for general adult medical examination with abnormal findings: Secondary | ICD-10-CM | POA: Diagnosis not present

## 2015-12-31 DIAGNOSIS — E559 Vitamin D deficiency, unspecified: Secondary | ICD-10-CM | POA: Diagnosis not present

## 2015-12-31 DIAGNOSIS — R7989 Other specified abnormal findings of blood chemistry: Secondary | ICD-10-CM

## 2015-12-31 DIAGNOSIS — I10 Essential (primary) hypertension: Secondary | ICD-10-CM

## 2015-12-31 DIAGNOSIS — E785 Hyperlipidemia, unspecified: Secondary | ICD-10-CM

## 2015-12-31 DIAGNOSIS — R6889 Other general symptoms and signs: Secondary | ICD-10-CM | POA: Diagnosis not present

## 2015-12-31 DIAGNOSIS — E119 Type 2 diabetes mellitus without complications: Secondary | ICD-10-CM

## 2015-12-31 DIAGNOSIS — Z136 Encounter for screening for cardiovascular disorders: Secondary | ICD-10-CM | POA: Diagnosis not present

## 2015-12-31 DIAGNOSIS — R945 Abnormal results of liver function studies: Secondary | ICD-10-CM

## 2015-12-31 DIAGNOSIS — T7840XA Allergy, unspecified, initial encounter: Secondary | ICD-10-CM

## 2015-12-31 DIAGNOSIS — D649 Anemia, unspecified: Secondary | ICD-10-CM

## 2015-12-31 DIAGNOSIS — Z79899 Other long term (current) drug therapy: Secondary | ICD-10-CM | POA: Diagnosis not present

## 2015-12-31 DIAGNOSIS — Z0000 Encounter for general adult medical examination without abnormal findings: Secondary | ICD-10-CM

## 2015-12-31 LAB — CBC WITH DIFFERENTIAL/PLATELET
Basophils Absolute: 0 cells/uL (ref 0–200)
Basophils Relative: 0 %
Eosinophils Absolute: 48 cells/uL (ref 15–500)
Eosinophils Relative: 1 %
HCT: 44.1 % (ref 38.5–50.0)
Hemoglobin: 14.7 g/dL (ref 13.2–17.1)
Lymphocytes Relative: 48 %
Lymphs Abs: 2304 cells/uL (ref 850–3900)
MCH: 28.7 pg (ref 27.0–33.0)
MCHC: 33.3 g/dL (ref 32.0–36.0)
MCV: 86 fL (ref 80.0–100.0)
MPV: 9.8 fL (ref 7.5–12.5)
Monocytes Absolute: 288 cells/uL (ref 200–950)
Monocytes Relative: 6 %
Neutro Abs: 2160 cells/uL (ref 1500–7800)
Neutrophils Relative %: 45 %
Platelets: 275 10*3/uL (ref 140–400)
RBC: 5.13 MIL/uL (ref 4.20–5.80)
RDW: 14.3 % (ref 11.0–15.0)
WBC: 4.8 10*3/uL (ref 3.8–10.8)

## 2015-12-31 LAB — FERRITIN: Ferritin: 255 ng/mL (ref 20–345)

## 2015-12-31 LAB — TSH: TSH: 1.5 mIU/L (ref 0.40–4.50)

## 2015-12-31 MED ORDER — CANAGLIFLOZIN-METFORMIN HCL 150-1000 MG PO TABS
ORAL_TABLET | ORAL | 6 refills | Status: DC
Start: 2015-12-31 — End: 2016-06-19

## 2015-12-31 NOTE — Patient Instructions (Addendum)
Vitamin D goal is between 60-80  Please make sure that you are taking your Vitamin D as directed.   It is very important as a natural anti-inflammatory   helping hair, skin, and nails, as well as reducing stroke and heart attack risk.   It helps your bones and helps with mood.  It also decreases numerous cancer risks so please take it as directed.   Low Vit D is associated with a 200-300% higher risk for CANCER   and 200-300% higher risk for HEART   ATTACK  &  STROKE.    .....................................Marland Kitchen  It is also associated with higher death rate at younger ages,   autoimmune diseases like Rheumatoid arthritis, Lupus, Multiple Sclerosis.     Also many other serious conditions, like depression, Alzheimer's  Dementia, infertility, muscle aches, fatigue, fibromyalgia - just to name a few.  +++++++++++++++++++  Can get liquid vitamin D from [place]  OR here in [place] at  [facility] alternatives [address], [place], [place] [address]   9 Ways to Naturally Increase Testosterone Levels  1.   Lose Weight If you're overweight, shedding the excess pounds may increase your testosterone levels, according to research presented at the Endocrine Society's 2012 meeting. Overweight men are more likely to have low testosterone levels to begin with, so this is an important trick to increase your body's testosterone production when you need it most.  2.   High-Intensity Exercise like Peak Fitness  Short intense exercise has a proven positive effect on increasing testosterone levels and preventing its decline. That's unlike aerobics or prolonged moderate exercise, which have shown to have negative or no effect on testosterone levels. Having a whey protein meal after exercise can further enhance the satiety/testosterone-boosting impact (hunger hormones cause the opposite effect on your testosterone and libido). Here's a summary of what a typical high-intensity Peak Fitness routine might  look like: " Warm up for three minutes  " Exercise as hard and fast as you can for 30 seconds. You should feel like you couldn't possibly go on another few seconds  " Recover at a slow to moderate pace for 90 seconds  " Repeat the high intensity exercise and recovery 7 more times .  3.   Consume Plenty of Zinc The mineral zinc is important for testosterone production, and supplementing your diet for as little as six weeks has been shown to cause a marked improvement in testosterone among men with low levels.1 Likewise, research has shown that restricting dietary sources of zinc leads to a significant decrease in testosterone, while zinc supplementation increases it2 -- and even protects men from exercised-induced reductions in testosterone levels.3 It's estimated that up to 45 percent of adults over the age of 60 may have lower than recommended zinc intakes; even when dietary supplements were added in, an estimated 20-25 percent of older adults still had inadequate zinc intakes, according to a [employer] and Nutrition Examination Survey.4 Your diet is the best source of zinc; along with protein-rich foods like meats and fish, other good dietary sources of zinc include raw milk, raw cheese, beans, and yogurt or kefir made from raw milk. It can be difficult to obtain enough dietary zinc if you're a vegetarian, and also for meat-eaters as well, largely because of conventional farming methods that rely heavily on chemical fertilizers and pesticides. These chemicals deplete the soil of nutrients ... nutrients like zinc that must be absorbed by plants in order to be passed on to you. In many cases,  you may further deplete the nutrients in your food by the way you prepare it. For most food, cooking it will drastically reduce its levels of nutrients like zinc . particularly over-cooking, which many people do. If you decide to use a zinc supplement, stick to a dosage of less than 40 mg a day, as this is the  recommended adult upper limit. Taking too much zinc can interfere with your body's ability to absorb other minerals, especially copper, and may cause nausea as a side effect.  4.   Strength Training In addition to Peak Fitness, strength training is also known to boost testosterone levels, provided you are doing so intensely enough. When strength training to boost testosterone, you'll want to increase the weight and lower your number of reps, and then focus on exercises that work a large number of muscles, such as dead lifts or squats.  You can "turbo-charge" your weight training by going slower. By slowing down your movement, you're actually turning it into a high-intensity exercise. Super Slow movement allows your muscle, at the microscopic level, to access the maximum number of cross-bridges between the protein filaments that produce movement in the muscle.   5.   Optimize Your Vitamin D Levels Vitamin D, a steroid hormone, is essential for the healthy development of the nucleus of the sperm cell, and helps maintain semen quality and sperm count. Vitamin D also increases levels of testosterone, which may boost libido. In one study, overweight men who were given vitamin D supplements had a significant increase in testosterone levels after one year.5   6.   Reduce Stress When you're under a lot of stress, your body releases high levels of the stress hormone cortisol. This hormone actually blocks the effects of testosterone,6 presumably because, from a biological standpoint, testosterone-associated behaviors (mating, competing, aggression) may have lowered your chances of survival in an emergency (hence, the "fight or flight" response is dominant, courtesy of cortisol).  7.   Limit or Eliminate Sugar from Your Diet Testosterone levels decrease after you eat sugar, which is likely because the sugar leads to a high insulin level, another factor leading to low testosterone.7 Based on USDA estimates, the  average American consumes 12 teaspoons of sugar a day, which equates to about TWO TONS of sugar during a lifetime.  8.   Eat Healthy Fats By healthy, this means not only mon- and polyunsaturated fats, like that found in avocadoes and nuts, but also saturated, as these are essential for building testosterone. Research shows that a diet with less than 40 percent of energy as fat (and that mainly from animal sources, i.e. saturated) lead to a decrease in testosterone levels.8 My personal diet is about 60-70 percent healthy fat, and other experts agree that the ideal diet includes somewhere between 50-70 percent fat.  It's important to understand that your body requires saturated fats from animal and vegetable sources (such as meat, dairy, certain oils, and tropical plants like coconut) for optimal functioning, and if you neglect this important food group in favor of sugar, grains and other starchy carbs, your health and weight are almost guaranteed to suffer. Examples of healthy fats you can eat more of to give your testosterone levels a boost include: Olives and Olive oil  Coconuts and coconut oil Butter made from raw grass-fed organic milk Raw nuts, such as, almonds or pecans Organic pastured egg yolks Avocados Grass-fed meats Palm oil Unheated organic nut oils   9.   Boost Your Intake of Branch  Chain Amino Acids (BCAA) from Foods Like Whey Protein Research suggests that BCAAs result in higher testosterone levels, particularly when taken along with resistance training.9 While BCAAs are available in supplement form, you'll find the highest concentrations of BCAAs like leucine in dairy products - especially quality cheeses and whey protein. Even when getting leucine from your natural food supply, it's often wasted or used as a building block instead of an anabolic agent. So to create the correct anabolic environment, you need to boost leucine consumption way beyond mere maintenance levels. That said, keep  in mind that using leucine as a free form amino acid can be highly counterproductive as when free form amino acids are artificially administrated, they rapidly enter your circulation while disrupting insulin function, and impairing your body's glycemic control. Food-based leucine is really the ideal form that can benefit your muscles without side effects.

## 2015-12-31 NOTE — Progress Notes (Signed)
Complete Physical   Assessment and Plan: Essential hypertension - continue medications, may be able to cut losartan in half with weight loss, DASH diet, exercise and monitor at home.  - CBC with Differential/Platelet - BASIC METABOLIC PANEL WITH GFR - Hepatic function panel - TSH - Urinalysis, Routine w reflex microscopic (not at [facility]) - Microalbumin / creatinine urine ratio  Hyperlipidemia -continue medications, check lipids, decrease fatty foods, increase activity.  - Lipid panel  Morbid Obesity  long discussion about weight loss, diet, and exercise  Diabetes mellitus without complication Discussed general issues about diabetes pathophysiology and management., Educational material distributed., Suggested low cholesterol diet., Encouraged aerobic exercise., Discussed foot care., Reminded to get yearly retinal exam. - Hemoglobin A1c - Insulin, fasting - LOW EXTREMITY NEUR EXAM DOCUM  Vitamin D deficiency - Vit D  25 hydroxy (rtn osteoporosis monitoring)   Allergy, initial encounter Continue OTC allergy pills  Elevated liver function tests Monitor, decreased LFTs  Routine general medical examination at a health care facility  Medication management - Magnesium   Discussed med's effects and SE's. Screening labs and tests as requested with regular follow-up as recommended.  HPI 30 y.o. male  presents for a complete physical. His blood pressure has been controlled at home, he is on losartan 100mg  did not take today, today their BP is BP: 130/80.  Working in [employer] with [employer] now, having first baby in April, boy, Louis Fitzpatrick, some increased stress with moving, commutes to [place].  He does workout. He denies chest pain, shortness of breath, dizziness.  He is on cholesterol medication and denies myalgias. His cholesterol is at goal, lipitor 80, 1/2 pill every other day The cholesterol last visit was:   Lab Results  Component Value Date   CHOL 142 01/16/2015   HDL 41  01/16/2015   LDLCALC 64 01/16/2015   TRIG 186 (H) 01/16/2015   CHOLHDL 3.5 01/16/2015   Lab Results  Component Value Date   ALT 97 (H) 01/16/2015   AST 49 (H) 01/16/2015   ALKPHOS 67 01/16/2015   BILITOT 0.4 01/16/2015   He has been working on diet and exercise for diabetes without complications, he has been checking his sugars at home every few weeks, no low blood sugars, he is on invokamet BID, and denies paresthesia of the feet, polydipsia and visual disturbances. Last A1C in the office was:  Lab Results  Component Value Date   HGBA1C 7.0 (H) 01/16/2015   Patient is not on Vitamin D supplement.  Lab Results  Component Value Date   VD25OH 11 (L) 01/16/2015   BMI is Body mass index is 36.12 kg/m., he is working on diet and exercise. Wt Readings from Last 3 Encounters:  12/31/15 244 lb 9.6 oz (110.9 kg)  01/16/15 266 lb 9.6 oz (120.9 kg)  08/17/14 256 lb 6.4 oz (116.3 kg)    Current Medications:  Current Outpatient Prescriptions on File Prior to Visit  Medication Sig Dispense Refill  . atorvastatin (LIPITOR) 80 MG tablet Take 1 tablet (80 mg total) by mouth daily. 90 tablet 1  . Canagliflozin-Metformin HCl (INVOKAMET) [phone] MG TABS 1 pill twice daily 60 tablet 0  . Cholecalciferol (VITAMIN D PO) Take 5,000 Int'l Units by mouth daily.    Marland Kitchen. losartan (COZAAR) 100 MG tablet Take 1 tablet (100 mg total) by mouth daily. 90 tablet 1  . metFORMIN (GLUCOPHAGE-XR) 500 MG 24 hr tablet TAKE 2 TABLETS BY MOUTH TWICE A DAY FOR DIABETES 360 tablet 0   No  current facility-administered medications on file prior to visit.    Health Maintenance:  Immunization History  Administered Date(s) Administered  . Tdap 01/28/2003, 05/26/2013   TDAP: 2015 Pneumovax: N/A Flu vaccine: 2017 Zostavax: N/A DEXA: N/A Colonoscopy: N/A EGD: N/A Eye doctor: 12/2014, forget doctor  Dentist: No dentist  Medical History:  Past Medical History:  Diagnosis Date  . Allergy   . Hyperlipidemia   .  Hypertension   . Obesity (BMI 30-39.9)   . Type II or unspecified type diabetes mellitus without mention of complication, not stated as uncontrolled   . Vitamin D deficiency    Allergies No Known Allergies  SURGICAL HISTORY He  has a past surgical history that includes Tonsillectomy (2007). FAMILY HISTORY His family history includes Diabetes in his maternal grandfather. SOCIAL HISTORY He  reports that he has never smoked. He has never used smokeless tobacco. He reports that he does not drink alcohol or use drugs.  Review of Systems  Constitutional: Negative.   HENT: Negative.   Eyes: Negative.   Respiratory: Negative.   Cardiovascular: Negative.   Gastrointestinal: Negative.   Genitourinary: Negative.   Musculoskeletal: Negative.   Skin: Negative.   Neurological: Negative.   Endo/Heme/Allergies: Negative.   Psychiatric/Behavioral: Negative.    Physical Exam: Estimated body mass index is 36.12 kg/m as calculated from the following:   Height as of this encounter: 5\' 9"  (1.753 m).   Weight as of this encounter: 244 lb 9.6 oz (110.9 kg). BP 130/80   Pulse 60   Temp 97.5 F (36.4 C)   Resp 16   Ht 5\' 9"  (1.753 m)   Wt 244 lb 9.6 oz (110.9 kg)   SpO2 98%   BMI 36.12 kg/m  General Appearance: Well nourished, in no apparent distress. Eyes: PERRLA, EOMs, conjunctiva no swelling or erythema, normal fundi and vessels. Sinuses: No Frontal/maxillary tenderness ENT/Mouth: Ext aud canals clear, normal light reflex with TMs without erythema, bulging. Good dentition. No erythema, swelling, or exudate on post pharynx. Tonsils not swollen or erythematous. Hearing normal.  Neck: Supple, thyroid normal. No bruits Respiratory: Respiratory effort normal, BS equal bilaterally without rales, rhonchi, wheezing or stridor. Cardio: RRR without murmurs, rubs or gallops. Brisk peripheral pulses without edema.  Chest: symmetric, with normal excursions and percussion. Abdomen: Soft, +BS. Non  tender, no guarding, rebound, hernias, masses, or organomegaly. .  Lymphatics: Non tender without lymphadenopathy.  Genitourinary: defer Musculoskeletal: Full ROM all peripheral extremities,5/5 strength, and normal gait. Skin: Warm, dry without rashes, lesions, ecchymosis.  Neuro: Cranial nerves intact, reflexes equal bilaterally. Normal muscle tone, no cerebellar symptoms. Sensation intact.  Psych: Awake and oriented X 3, normal affect, Insight and Judgment appropriate.   EKG: Sinus brady, no ST changes  Louis MullingAmanda Fitzpatrick Credit 2:09 PM

## 2016-01-01 ENCOUNTER — Other Ambulatory Visit: Payer: Self-pay | Admitting: Physician Assistant

## 2016-01-01 DIAGNOSIS — E785 Hyperlipidemia, unspecified: Secondary | ICD-10-CM

## 2016-01-01 DIAGNOSIS — R945 Abnormal results of liver function studies: Secondary | ICD-10-CM

## 2016-01-01 DIAGNOSIS — R7989 Other specified abnormal findings of blood chemistry: Secondary | ICD-10-CM

## 2016-01-01 LAB — IRON AND TIBC
%SAT: 37 % (ref 15–60)
Iron: 128 ug/dL (ref 50–180)
TIBC: 346 ug/dL (ref 250–425)
UIBC: 218 ug/dL (ref 125–400)

## 2016-01-01 LAB — URINALYSIS, ROUTINE W REFLEX MICROSCOPIC
Bilirubin Urine: NEGATIVE
Hgb urine dipstick: NEGATIVE
Ketones, ur: NEGATIVE
Leukocytes, UA: NEGATIVE
Nitrite: NEGATIVE
Protein, ur: NEGATIVE
Specific Gravity, Urine: 1.029 (ref 1.001–1.035)
pH: 5.5 (ref 5.0–8.0)

## 2016-01-01 LAB — URINALYSIS, MICROSCOPIC ONLY
Bacteria, UA: NONE SEEN [HPF]
Casts: NONE SEEN [LPF]
Crystals: NONE SEEN [HPF]
RBC / HPF: NONE SEEN RBC/HPF (ref <=2)
Squamous Epithelial / LPF: NONE SEEN [HPF] (ref <=5)
WBC, UA: NONE SEEN WBC/HPF (ref <=5)
Yeast: NONE SEEN [HPF]

## 2016-01-01 LAB — HEPATIC FUNCTION PANEL
ALT: 104 U/L — ABNORMAL HIGH (ref 9–46)
AST: 47 U/L — ABNORMAL HIGH (ref 10–40)
Albumin: 4.5 g/dL (ref 3.6–5.1)
Alkaline Phosphatase: 65 U/L (ref 40–115)
Bilirubin, Direct: 0.2 mg/dL (ref <=0.2)
Indirect Bilirubin: 0.5 mg/dL (ref 0.2–1.2)
Total Bilirubin: 0.7 mg/dL (ref 0.2–1.2)
Total Protein: 7 g/dL (ref 6.1–8.1)

## 2016-01-01 LAB — BASIC METABOLIC PANEL WITH GFR
BUN: 13 mg/dL (ref 7–25)
CO2: 24 mmol/L (ref 20–31)
Calcium: 9.7 mg/dL (ref 8.6–10.3)
Chloride: 105 mmol/L (ref 98–110)
Creat: 1 mg/dL (ref 0.60–1.35)
GFR, Est African American: >89 mL/min (ref >=60)
GFR, Est Non African American: >89 mL/min (ref >=60)
Glucose, Bld: 98 mg/dL (ref 65–99)
Potassium: 4.2 mmol/L (ref 3.5–5.3)
Sodium: 140 mmol/L (ref 135–146)

## 2016-01-01 LAB — MAGNESIUM: Magnesium: 1.9 mg/dL (ref 1.5–2.5)

## 2016-01-01 LAB — LIPID PANEL
Cholesterol: 161 mg/dL (ref <200)
HDL: 47 mg/dL (ref >40)
LDL Cholesterol: 102 mg/dL — ABNORMAL HIGH (ref <100)
Total CHOL/HDL Ratio: 3.4 Ratio (ref <5.0)
Triglycerides: 62 mg/dL (ref <150)
VLDL: 12 mg/dL (ref <30)

## 2016-01-01 LAB — MICROALBUMIN / CREATININE URINE RATIO
Creatinine, Urine: 100 mg/dL (ref 20–370)
Microalb Creat Ratio: 7 mcg/mg creat (ref <30)
Microalb, Ur: 0.7 mg/dL

## 2016-01-01 LAB — HEMOGLOBIN A1C
Hgb A1c MFr Bld: 6.6 % — ABNORMAL HIGH (ref <5.7)
Mean Plasma Glucose: 143 mg/dL

## 2016-01-01 LAB — VITAMIN D 25 HYDROXY (VIT D DEFICIENCY, FRACTURES): Vit D, 25-Hydroxy: 15 ng/mL — ABNORMAL LOW (ref 30–100)

## 2016-01-03 DIAGNOSIS — M62838 Other muscle spasm: Secondary | ICD-10-CM | POA: Diagnosis not present

## 2016-01-29 DIAGNOSIS — H6123 Impacted cerumen, bilateral: Secondary | ICD-10-CM | POA: Diagnosis not present

## 2016-03-11 ENCOUNTER — Encounter: Payer: Self-pay | Admitting: Physician Assistant

## 2016-03-26 ENCOUNTER — Encounter: Payer: Self-pay | Admitting: Physician Assistant

## 2016-04-09 ENCOUNTER — Ambulatory Visit: Payer: Self-pay | Admitting: Physician Assistant

## 2016-04-09 DIAGNOSIS — S339XXA Sprain of unspecified parts of lumbar spine and pelvis, initial encounter: Secondary | ICD-10-CM | POA: Diagnosis not present

## 2016-04-09 DIAGNOSIS — E1065 Type 1 diabetes mellitus with hyperglycemia: Secondary | ICD-10-CM | POA: Diagnosis not present

## 2016-04-09 DIAGNOSIS — R81 Glycosuria: Secondary | ICD-10-CM | POA: Diagnosis not present

## 2016-04-14 ENCOUNTER — Other Ambulatory Visit: Payer: Self-pay | Admitting: Physician Assistant

## 2016-04-16 ENCOUNTER — Other Ambulatory Visit: Payer: Self-pay | Admitting: Physician Assistant

## 2016-04-16 ENCOUNTER — Other Ambulatory Visit: Payer: Self-pay | Admitting: *Deleted

## 2016-04-16 MED ORDER — CANAGLIFLOZIN-METFORMIN HCL 150-1000 MG PO TABS: 1.0000 | ORAL_TABLET | Freq: Two times a day (BID) | ORAL | 1 refills | Status: DC

## 2016-04-23 ENCOUNTER — Other Ambulatory Visit: Payer: Self-pay | Admitting: Physician Assistant

## 2016-04-23 DIAGNOSIS — I10 Essential (primary) hypertension: Secondary | ICD-10-CM

## 2016-06-19 ENCOUNTER — Other Ambulatory Visit: Payer: Self-pay | Admitting: Physician Assistant

## 2016-07-22 ENCOUNTER — Ambulatory Visit: Payer: Self-pay | Admitting: Physician Assistant

## 2016-09-08 ENCOUNTER — Encounter: Payer: Self-pay | Admitting: Physician Assistant

## 2016-09-17 ENCOUNTER — Encounter: Payer: Self-pay | Admitting: *Deleted

## 2016-09-19 DIAGNOSIS — S138XXA Sprain of joints and ligaments of other parts of neck, initial encounter: Secondary | ICD-10-CM | POA: Diagnosis not present

## 2016-09-22 ENCOUNTER — Other Ambulatory Visit: Payer: Self-pay | Admitting: Internal Medicine

## 2016-09-26 ENCOUNTER — Ambulatory Visit: Payer: Self-pay | Admitting: Physician Assistant

## 2016-10-20 ENCOUNTER — Other Ambulatory Visit: Payer: Self-pay

## 2016-10-20 MED ORDER — ATORVASTATIN CALCIUM 80 MG PO TABS
80.0000 mg | ORAL_TABLET | Freq: Every day | ORAL | 0 refills | Status: DC
Start: 2016-10-20 — End: 2017-01-18

## 2016-10-25 ENCOUNTER — Other Ambulatory Visit: Payer: Self-pay | Admitting: Internal Medicine

## 2016-10-25 DIAGNOSIS — I10 Essential (primary) hypertension: Secondary | ICD-10-CM

## 2016-10-29 NOTE — Progress Notes (Deleted)
Assessment and Plan:   Continue diet and meds as discussed. Further disposition pending results of labs. Discussed med's effects and SE's.    HPI 31 y.o. male  presents for 3 month follow up with hypertension, hyperlipidemia, diabetes and vitamin D. His blood pressure is not checked at home , losartan , today their BP is   He does not workout, due to stress. He denies chest pain, shortness of breath, dizziness.  He is on cholesterol medication, atorvastatin 80 every other day and denies myalgias. His cholesterol is at goal. The cholesterol was:  12/31/2015: Cholesterol 161; HDL 47; LDL Cholesterol 102; Triglycerides 62 He has been working on diet and exercise for Diabetes without complications, he is not on bASA, he is on ACE/ARB, and denies  paresthesia of the feet, polydipsia, polyuria and visual disturbances. Last A1C was: 12/31/2015: Hgb A1c MFr Bld 6.6  Lab Results  Component Value Date   GFRAA >89 12/31/2015  Patient is on Vitamin D supplement. 12/31/2015: Vit D, 25-Hydroxy 15 BMI is There is no height or weight on file to calculate BMI., he is working on diet and exercise.  Wt Readings from Last 3 Encounters:  12/31/15 244 lb 9.6 oz (110.9 kg)  01/16/15 266 lb 9.6 oz (120.9 kg)  08/17/14 256 lb 6.4 oz (116.3 kg)   Current Medications:  Current Outpatient Prescriptions on File Prior to Visit  Medication Sig Dispense Refill  . atorvastatin (LIPITOR) 80 MG tablet Take 1 tablet (80 mg total) by mouth daily. 90 tablet 0  . Canagliflozin-Metformin HCl (INVOKAMET) [phone] MG TABS Take 1 tablet 2 x / day for Diabetes - Must have Office visit before refills -  Cancelled or missed 4 OV's & Not seen in 10 months ! 20 tablet 0  . Cholecalciferol (VITAMIN D PO) Take 5,000 Int'l Units by mouth daily.    Marland Kitchen losartan (COZAAR) 100 MG tablet TAKE 1 TABLET (100 MG TOTAL) BY MOUTH DAILY. 7 tablet 0   No current facility-administered medications on file prior to visit.    Medical History:  Past  Medical History:  Diagnosis Date  . Allergy   . Hyperlipidemia   . Hypertension   . Obesity (BMI 30-39.9)   . Type II or unspecified type diabetes mellitus without mention of complication, not stated as uncontrolled   . Vitamin D deficiency    Allergies: No Known Allergies   Review of Systems:  Review of Systems  Constitutional: Negative.   HENT: Negative.   Eyes: Negative.   Respiratory: Negative.   Cardiovascular: Negative.   Gastrointestinal: Negative.   Genitourinary: Negative.   Musculoskeletal: Negative.   Skin: Negative.   Neurological: Negative.   Endo/Heme/Allergies: Negative.   Psychiatric/Behavioral: Negative.     Family history- Review and unchanged Social history- Review and unchanged Physical Exam: There were no vitals taken for this visit. Wt Readings from Last 3 Encounters:  12/31/15 244 lb 9.6 oz (110.9 kg)  01/16/15 266 lb 9.6 oz (120.9 kg)  08/17/14 256 lb 6.4 oz (116.3 kg)   General Appearance: Well nourished, in no apparent distress. Eyes: PERRLA, EOMs, conjunctiva no swelling or erythema Sinuses: No Frontal/maxillary tenderness ENT/Mouth: Ext aud canals clear, TMs without erythema, bulging. No erythema, swelling, or exudate on post pharynx.  Tonsils not swollen or erythematous. Hearing normal.  Neck: Supple, thyroid normal.  Respiratory: Respiratory effort normal, BS equal bilaterally without rales, rhonchi, wheezing or stridor.  Cardio: RRR with no MRGs. Brisk peripheral pulses without edema.  Abdomen: Soft, +  BS.  Non tender, no guarding, rebound, hernias, masses. Lymphatics: Non tender without lymphadenopathy.  Musculoskeletal: Full ROM, 5/5 strength, normal gait.  Skin: Warm, dry without rashes, lesions, ecchymosis.  Neuro: Cranial nerves intact. No cerebellar symptoms. Sensation intact.  Psych: Awake and oriented X 3, normal affect, Insight and Judgment appropriate.    [name], PA-C 7:42 AM [facility]

## 2016-10-30 ENCOUNTER — Ambulatory Visit: Payer: Self-pay | Admitting: Physician Assistant

## 2016-11-14 ENCOUNTER — Encounter: Payer: Self-pay | Admitting: Physician Assistant

## 2016-11-14 MED ORDER — METFORMIN HCL ER 500 MG PO TB24: ORAL_TABLET | ORAL | 0 refills | Status: DC

## 2016-11-14 MED ORDER — CANAGLIFLOZIN-METFORMIN HCL 150-1000 MG PO TABS: ORAL_TABLET | ORAL | 0 refills | Status: DC

## 2016-11-14 NOTE — Addendum Note (Signed)
Addended by: Quentin MullingOLLIER, Joel Cowin R on: 11/14/2016 10:42 AM   Modules accepted: Orders

## 2016-12-04 ENCOUNTER — Ambulatory Visit (INDEPENDENT_AMBULATORY_CARE_PROVIDER_SITE_OTHER): Payer: BLUE CROSS/BLUE SHIELD | Admitting: Physician Assistant

## 2016-12-04 ENCOUNTER — Encounter: Payer: Self-pay | Admitting: Physician Assistant

## 2016-12-04 VITALS — BP 126/82 | HR 78 | Temp 97.5°F | Resp 16 | Ht 69.0 in | Wt 245.8 lb

## 2016-12-04 DIAGNOSIS — E119 Type 2 diabetes mellitus without complications: Secondary | ICD-10-CM

## 2016-12-04 DIAGNOSIS — E785 Hyperlipidemia, unspecified: Secondary | ICD-10-CM

## 2016-12-04 DIAGNOSIS — I10 Essential (primary) hypertension: Secondary | ICD-10-CM

## 2016-12-04 DIAGNOSIS — Z79899 Other long term (current) drug therapy: Secondary | ICD-10-CM | POA: Diagnosis not present

## 2016-12-04 DIAGNOSIS — Z1159 Encounter for screening for other viral diseases: Secondary | ICD-10-CM

## 2016-12-04 DIAGNOSIS — E559 Vitamin D deficiency, unspecified: Secondary | ICD-10-CM

## 2016-12-04 NOTE — Progress Notes (Signed)
Assessment and Plan:   Essential hypertension - continue medications, DASH diet, exercise and monitor at home. Call if greater than 130/80.  -     CBC with Differential/Platelet -     BASIC METABOLIC PANEL WITH GFR -     Hepatic function panel -     TSH  Diabetes mellitus without complication (HCC) Discussed general issues about diabetes pathophysiology and management., Educational material distributed., Suggested low cholesterol diet., Encouraged aerobic exercise., Discussed foot care., Reminded to get yearly retinal exam. -     Hemoglobin A1c  Hyperlipidemia, unspecified hyperlipidemia type -continue medications, check lipids, decrease fatty foods, increase activity.  -     Lipid panel  Morbid obesity (HCC) - long discussion about weight loss, diet, and exercise  Medication management -     Magnesium  Vitamin D deficiency Get on supplement  Continue diet and meds as discussed. Further disposition pending results of labs. Discussed med's effects and SE's.   Future Appointments  Date Time Provider Department Center  03/26/2017 10:00 AM Quentin Mullingollier, Kyira Volkert, PA-C [id number] None    HPI 31 y.o. male  presents for 3 month follow up with hypertension, hyperlipidemia, diabetes and vitamin D. His blood pressure is not checked at home , losartan 100mg , today their BP is BP: 126/82. He does not workout, due to stress, has 827 month old, braden.  He denies chest pain, shortness of breath, dizziness.  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 His cholesterol is at goal. The cholesterol was:  12/31/2015: Cholesterol 161; HDL 47; LDL Cholesterol 102; Triglycerides 62 He has been working on diet and exercise for Diabetes without complications, he is not on bASA, he is on ACE/ARB, and denies  paresthesia of the feet, polydipsia, polyuria and visual disturbances. Last A1C was: 12/31/2015: Hgb A1c MFr Bld 6.6  Lab Results   Component Value Date   GFRAA >89 12/31/2015  Patient is on Vitamin D supplement. 12/31/2015: Vit D, 25-Hydroxy 15 BMI is Body mass index is 36.3 kg/m., he is working on diet and exercise.  Wt Readings from Last 3 Encounters:  12/04/16 245 lb 12.8 oz (111.5 kg)  12/31/15 244 lb 9.6 oz (110.9 kg)  01/16/15 266 lb 9.6 oz (120.9 kg)   Current Medications:  Current Outpatient Medications on File Prior to Visit  Medication Sig Dispense Refill  . atorvastatin (LIPITOR) 80 MG tablet Take 1 tablet (80 mg total) by mouth daily. 90 tablet 0  . Canagliflozin-Metformin HCl (INVOKAMET) [phone] MG TABS Take 1 tablet 2 x / day for Diabetes -has OV Nov, no refills after this 60 tablet 0  . Cholecalciferol (VITAMIN D PO) Take 5,000 Int'l Units by mouth daily.    Marland Kitchen. losartan (COZAAR) 100 MG tablet TAKE 1 TABLET (100 MG TOTAL) BY MOUTH DAILY. 7 tablet 0  . metFORMIN (GLUCOPHAGE XR) 500 MG 24 hr tablet Take 1 to 2 tablets 2 x day for Diabetes 120 tablet 0   No current facility-administered medications on file prior to visit.    Medical History:  Past Medical History:  Diagnosis Date  . Allergy   . Hyperlipidemia   . Hypertension   . Obesity (BMI 30-39.9)   . Type II or unspecified type diabetes mellitus without mention of complication, not stated as uncontrolled   . Vitamin D deficiency    Allergies: No Known Allergies   Review of Systems:  Review of Systems  Constitutional: Negative.  HENT: Negative.   Eyes: Negative.   Respiratory: Negative.   Cardiovascular: Negative.   Gastrointestinal: Negative.   Genitourinary: Negative.   Musculoskeletal: Negative.   Skin: Negative.   Neurological: Negative.   Endo/Heme/Allergies: Negative.   Psychiatric/Behavioral: Negative.     Family history- Review and unchanged Social history- Review and unchanged Physical Exam: BP 126/82   Pulse 78   Temp (!) 97.5 F (36.4 C)   Resp 16   Ht 5\' 9"  (1.753 m)   Wt 245 lb 12.8 oz (111.5 kg)   SpO2  98%   BMI 36.30 kg/m  Wt Readings from Last 3 Encounters:  12/04/16 245 lb 12.8 oz (111.5 kg)  12/31/15 244 lb 9.6 oz (110.9 kg)  01/16/15 266 lb 9.6 oz (120.9 kg)   General Appearance: Well nourished, in no apparent distress. Eyes: PERRLA, EOMs, conjunctiva no swelling or erythema Sinuses: No Frontal/maxillary tenderness ENT/Mouth: Ext aud canals clear, TMs without erythema, bulging. No erythema, swelling, or exudate on post pharynx.  Tonsils not swollen or erythematous. Hearing normal.  Neck: Supple, thyroid normal.  Respiratory: Respiratory effort normal, BS equal bilaterally without rales, rhonchi, wheezing or stridor.  Cardio: RRR with no MRGs. Brisk peripheral pulses without edema.  Abdomen: Soft, + BS.  Non tender, no guarding, rebound, hernias, masses. Lymphatics: Non tender without lymphadenopathy.  Musculoskeletal: Full ROM, 5/5 strength, normal gait.  Skin: Warm, dry without rashes, lesions, ecchymosis.  Neuro: Cranial nerves intact. No cerebellar symptoms. Sensation intact.  Psych: Awake and oriented X 3, normal affect, Insight and Judgment appropriate.    Quentin MullingAmanda Jhania Etherington, PA-C 11:43 AM [facility]

## 2016-12-04 NOTE — Patient Instructions (Addendum)
  Vitamin D goal is between 60-80  Please make sure that you are taking your Vitamin D as directed.   It is very important as a natural anti-inflammatory   helping hair, skin, and nails, as well as reducing stroke and heart attack risk.   It helps your bones and helps with mood.  We want you on at least 5000 IU daily  It also decreases numerous cancer risks so please take it as directed.   Low Vit D is associated with a 200-300% higher risk for CANCER   and 200-300% higher risk for HEART   ATTACK  &  STROKE.    ......................................  It is also associated with higher death rate at younger ages,   autoimmune diseases like Rheumatoid arthritis, Lupus, Multiple Sclerosis.     Also many other serious conditions, like depression, Alzheimer's  Dementia, infertility, muscle aches, fatigue, fibromyalgia - just to name a few.  +++++++++++++++++++  Can get liquid vitamin D from [employer]  OR here in [place] at  [facility] [address], [place],  [address] Or you can try earth fare     Bad carbs also include fruit juice, alcohol, and sweet tea. These are empty calories that do not signal to your brain that you are full.   Please remember the good carbs are still carbs which convert into sugar. So please measure them out no more than 1/2-1 cup of rice, oatmeal, pasta, and beans  Veggies are however free foods! Pile them on.   Not all fruit is created equal. Please see the list below, the fruit at the bottom is higher in sugars than the fruit at the top. Please avoid all dried fruits.     

## 2016-12-05 LAB — LIPID PANEL
Cholesterol: 164 mg/dL (ref <200)
HDL: 51 mg/dL (ref >40)
LDL Cholesterol (Calc): 94 mg/dL (calc)
Non-HDL Cholesterol (Calc): 113 mg/dL (calc) (ref <130)
Total CHOL/HDL Ratio: 3.2 (calc) (ref <5.0)
Triglycerides: 94 mg/dL (ref <150)

## 2016-12-05 LAB — HEPATIC FUNCTION PANEL
AG Ratio: 1.7 (calc) (ref 1.0–2.5)
ALT: 155 U/L — ABNORMAL HIGH (ref 9–46)
AST: 69 U/L — ABNORMAL HIGH (ref 10–40)
Albumin: 4.7 g/dL (ref 3.6–5.1)
Alkaline phosphatase (APISO): 81 U/L (ref 40–115)
Bilirubin, Direct: 0.1 mg/dL (ref 0.0–0.2)
Globulin: 2.7 g/dL (calc) (ref 1.9–3.7)
Indirect Bilirubin: 0.5 mg/dL (calc) (ref 0.2–1.2)
Total Bilirubin: 0.6 mg/dL (ref 0.2–1.2)
Total Protein: 7.4 g/dL (ref 6.1–8.1)

## 2016-12-05 LAB — BASIC METABOLIC PANEL WITH GFR
BUN: 11 mg/dL (ref 7–25)
CO2: 25 mmol/L (ref 20–32)
Calcium: 9.9 mg/dL (ref 8.6–10.3)
Chloride: 103 mmol/L (ref 98–110)
Creat: 0.88 mg/dL (ref 0.60–1.35)
GFR, Est African American: 133 mL/min/{1.73_m2} (ref >=60)
GFR, Est Non African American: 114 mL/min/{1.73_m2} (ref >=60)
Glucose, Bld: 122 mg/dL — ABNORMAL HIGH (ref 65–99)
Potassium: 4.1 mmol/L (ref 3.5–5.3)
Sodium: 139 mmol/L (ref 135–146)

## 2016-12-05 LAB — CBC WITH DIFFERENTIAL/PLATELET
Basophils Absolute: 39 cells/uL (ref 0–200)
Basophils Relative: 0.7 %
Eosinophils Absolute: 78 cells/uL (ref 15–500)
Eosinophils Relative: 1.4 %
HCT: 45.7 % (ref 38.5–50.0)
Hemoglobin: 15.8 g/dL (ref 13.2–17.1)
Lymphs Abs: 2464 cells/uL (ref 850–3900)
MCH: 28.3 pg (ref 27.0–33.0)
MCHC: 34.6 g/dL (ref 32.0–36.0)
MCV: 81.9 fL (ref 80.0–100.0)
MPV: 10.7 fL (ref 7.5–12.5)
Monocytes Relative: 7.2 %
Neutro Abs: 2615 cells/uL (ref 1500–7800)
Neutrophils Relative %: 46.7 %
Platelets: 301 10*3/uL (ref 140–400)
RBC: 5.58 10*6/uL (ref 4.20–5.80)
RDW: 13.2 % (ref 11.0–15.0)
Total Lymphocyte: 44 %
WBC mixed population: 403 cells/uL (ref 200–950)
WBC: 5.6 10*3/uL (ref 3.8–10.8)

## 2016-12-05 LAB — TSH: TSH: 1.08 mIU/L (ref 0.40–4.50)

## 2016-12-05 LAB — HEMOGLOBIN A1C
Hgb A1c MFr Bld: 7.4 % of total Hgb — ABNORMAL HIGH (ref <5.7)
Mean Plasma Glucose: 166 (calc)
eAG (mmol/L): 9.2 (calc)

## 2016-12-05 LAB — MAGNESIUM: Magnesium: 2.1 mg/dL (ref 1.5–2.5)

## 2016-12-07 ENCOUNTER — Other Ambulatory Visit: Payer: Self-pay | Admitting: Physician Assistant

## 2016-12-07 ENCOUNTER — Encounter: Payer: Self-pay | Admitting: Physician Assistant

## 2016-12-07 DIAGNOSIS — R7989 Other specified abnormal findings of blood chemistry: Secondary | ICD-10-CM

## 2016-12-07 DIAGNOSIS — R945 Abnormal results of liver function studies: Principal | ICD-10-CM

## 2016-12-11 ENCOUNTER — Other Ambulatory Visit: Payer: BLUE CROSS/BLUE SHIELD

## 2016-12-11 DIAGNOSIS — R945 Abnormal results of liver function studies: Principal | ICD-10-CM

## 2016-12-11 DIAGNOSIS — R7989 Other specified abnormal findings of blood chemistry: Secondary | ICD-10-CM

## 2016-12-12 ENCOUNTER — Encounter: Payer: Self-pay | Admitting: Physician Assistant

## 2016-12-14 ENCOUNTER — Other Ambulatory Visit: Payer: Self-pay | Admitting: Physician Assistant

## 2016-12-15 LAB — ANA: Anti Nuclear Antibody(ANA): NEGATIVE

## 2016-12-15 LAB — HEPATIC FUNCTION PANEL
AG Ratio: 1.8 (calc) (ref 1.0–2.5)
ALT: 101 U/L — ABNORMAL HIGH (ref 9–46)
AST: 47 U/L — ABNORMAL HIGH (ref 10–40)
Albumin: 4.5 g/dL (ref 3.6–5.1)
Alkaline phosphatase (APISO): 79 U/L (ref 40–115)
Bilirubin, Direct: 0.1 mg/dL (ref 0.0–0.2)
Globulin: 2.5 g/dL (calc) (ref 1.9–3.7)
Indirect Bilirubin: 0.4 mg/dL (calc) (ref 0.2–1.2)
Total Bilirubin: 0.5 mg/dL (ref 0.2–1.2)
Total Protein: 7 g/dL (ref 6.1–8.1)

## 2016-12-15 LAB — MITOCHONDRIAL ANTIBODIES: Mitochondrial M2 Ab, IgG: <=20 U

## 2016-12-15 LAB — HEPATITIS A ANTIBODY, TOTAL: Hepatitis A AB,Total: NONREACTIVE

## 2016-12-15 LAB — ANTI-SMOOTH MUSCLE ANTIBODY, IGG: Actin (Smooth Muscle) Antibody (IGG): <20 U (ref <20)

## 2016-12-15 LAB — HEPATITIS C ANTIBODY
Hepatitis C Ab: NONREACTIVE
SIGNAL TO CUT-OFF: 0.02 (ref <1.00)

## 2016-12-15 LAB — CERULOPLASMIN: Ceruloplasmin: 26 mg/dL (ref 18–36)

## 2016-12-15 LAB — ANTI-DNA ANTIBODY, DOUBLE-STRANDED: ds DNA Ab: 1 IU/mL

## 2016-12-15 LAB — HEPATITIS B SURFACE ANTIBODY,QUALITATIVE: Hep B S Ab: NONREACTIVE

## 2016-12-15 LAB — FERRITIN: Ferritin: 327 ng/mL (ref 20–345)

## 2016-12-15 LAB — HEPATITIS B CORE ANTIBODY, TOTAL: Hep B Core Total Ab: NONREACTIVE

## 2016-12-15 LAB — ALPHA-1-ANTITRYPSIN: A-1 Antitrypsin, Ser: 109 mg/dL (ref 83–199)

## 2016-12-15 LAB — HEPATITIS B E ANTIBODY: Hep B E Ab: NONREACTIVE

## 2016-12-30 ENCOUNTER — Encounter: Payer: Self-pay | Admitting: Physician Assistant

## 2016-12-31 DIAGNOSIS — E119 Type 2 diabetes mellitus without complications: Secondary | ICD-10-CM | POA: Diagnosis not present

## 2016-12-31 DIAGNOSIS — M545 Low back pain: Secondary | ICD-10-CM | POA: Diagnosis not present

## 2017-01-06 ENCOUNTER — Encounter: Payer: Self-pay | Admitting: Physician Assistant

## 2017-01-18 ENCOUNTER — Other Ambulatory Visit: Payer: Self-pay | Admitting: Physician Assistant

## 2017-01-21 ENCOUNTER — Other Ambulatory Visit: Payer: Self-pay

## 2017-01-21 MED ORDER — METFORMIN HCL ER 500 MG PO TB24: ORAL_TABLET | ORAL | 0 refills | Status: DC

## 2017-03-24 NOTE — Progress Notes (Deleted)
Complete Physical   Assessment and Plan: Essential hypertension - continue medications, may be able to cut losartan in half with weight loss, DASH diet, exercise and monitor at home.  - CBC with Differential/Platelet - BASIC METABOLIC PANEL WITH GFR - Hepatic function panel - TSH - Urinalysis, Routine w reflex microscopic (not at [facility]) - Microalbumin / creatinine urine ratio  Hyperlipidemia -continue medications, check lipids, decrease fatty foods, increase activity.  - Lipid panel  Morbid Obesity  long discussion about weight loss, diet, and exercise  Diabetes mellitus without complication Discussed general issues about diabetes pathophysiology and management., Educational material distributed., Suggested low cholesterol diet., Encouraged aerobic exercise., Discussed foot care., Reminded to get yearly retinal exam. - Hemoglobin A1c - Insulin, fasting - LOW EXTREMITY NEUR EXAM DOCUM  Vitamin D deficiency - Vit D  25 hydroxy (rtn osteoporosis monitoring)   Allergy, initial encounter Continue OTC allergy pills  Elevated liver function tests Monitor, decreased LFTs  Routine general medical examination at a health care facility  Medication management - Magnesium   Discussed med's effects and SE's. Screening labs and tests as requested with regular follow-up as recommended.  HPI 32 y.o. male  presents for a complete physical. His blood pressure has been controlled at home, he is on losartan 100mg  did not take today, today their BP is  .  Working in [employer] with [employer] now, having first baby in April, boy, Joesph Fillers, some increased stress with moving, commutes to .  He does workout. He denies chest pain, shortness of breath, dizziness.  He is on cholesterol medication and denies myalgias. His cholesterol is at goal, lipitor 80, 1/2 pill every other day The cholesterol last visit was:   Lab Results  Component Value Date   CHOL 164 12/04/2016   HDL 51 12/04/2016   LDLCALC 102 (H) 12/31/2015   TRIG 94 12/04/2016   CHOLHDL 3.2 12/04/2016   Lab Results  Component Value Date   ALT 101 (H) 12/11/2016   AST 47 (H) 12/11/2016   ALKPHOS 65 12/31/2015   BILITOT 0.5 12/11/2016   He has been working on diet and exercise for diabetes without complications, he has been checking his sugars at home every few weeks, no low blood sugars, he is on invokamet BID, and denies paresthesia of the feet, polydipsia and visual disturbances. Last A1C in the office was:  Lab Results  Component Value Date   HGBA1C 7.4 (H) 12/04/2016   Patient is not on Vitamin D supplement.  Lab Results  Component Value Date   VD25OH 15 (L) 12/31/2015   BMI is There is no height or weight on file to calculate BMI., he is working on diet and exercise. Wt Readings from Last 3 Encounters:  12/04/16 245 lb 12.8 oz (111.5 kg)  12/31/15 244 lb 9.6 oz (110.9 kg)  01/16/15 266 lb 9.6 oz (120.9 kg)    Current Medications:  Current Outpatient Medications on File Prior to Visit  Medication Sig Dispense Refill  . atorvastatin (LIPITOR) 80 MG tablet TAKE 1 TABLET BY MOUTH EVERY DAY 90 tablet 1  . Cholecalciferol (VITAMIN D PO) Take 5,000 Int'l Units by mouth daily.    Marland Kitchen losartan (COZAAR) 100 MG tablet TAKE 1 TABLET (100 MG TOTAL) BY MOUTH DAILY. 7 tablet 0  . metFORMIN (GLUCOPHAGE-XR) 500 MG 24 hr tablet Take 1 to 2 tablets 2 x day for diabetes 360 tablet 0   No current facility-administered medications on file prior to visit.    Health Maintenance:  Immunization History  Administered Date(s) Administered  . Tdap 01/28/2003, 05/26/2013   TDAP: 2015 Pneumovax: N/A Flu vaccine: 2017 Zostavax: N/A DEXA: N/A Colonoscopy: N/A EGD: N/A Eye doctor: 12/2014, forget doctor  Dentist: No dentist  Medical History:  Past Medical History:  Diagnosis Date  . Allergy   . Hyperlipidemia   . Hypertension   . Obesity (BMI 30-39.9)   . Type II or unspecified type diabetes mellitus without  mention of complication, not stated as uncontrolled   . Vitamin D deficiency    Allergies No Known Allergies  SURGICAL HISTORY He  has a past surgical history that includes Tonsillectomy (2007). FAMILY HISTORY His family history includes Diabetes in his maternal grandfather. SOCIAL HISTORY He  reports that  has never smoked. he has never used smokeless tobacco. He reports that he does not drink alcohol or use drugs.  Review of Systems  Constitutional: Negative.   HENT: Negative.   Eyes: Negative.   Respiratory: Negative.   Cardiovascular: Negative.   Gastrointestinal: Negative.   Genitourinary: Negative.   Musculoskeletal: Negative.   Skin: Negative.   Neurological: Negative.   Endo/Heme/Allergies: Negative.   Psychiatric/Behavioral: Negative.    Physical Exam: Estimated body mass index is 36.3 kg/m as calculated from the following:   Height as of 12/04/16: 5\' 9"  (1.753 m).   Weight as of 12/04/16: 245 lb 12.8 oz (111.5 kg). There were no vitals taken for this visit. General Appearance: Well nourished, in no apparent distress. Eyes: PERRLA, EOMs, conjunctiva no swelling or erythema, normal fundi and vessels. Sinuses: No Frontal/maxillary tenderness ENT/Mouth: Ext aud canals clear, normal light reflex with TMs without erythema, bulging. Good dentition. No erythema, swelling, or exudate on post pharynx. Tonsils not swollen or erythematous. Hearing normal.  Neck: Supple, thyroid normal. No bruits Respiratory: Respiratory effort normal, BS equal bilaterally without rales, rhonchi, wheezing or stridor. Cardio: RRR without murmurs, rubs or gallops. Brisk peripheral pulses without edema.  Chest: symmetric, with normal excursions and percussion. Abdomen: Soft, +BS. Non tender, no guarding, rebound, hernias, masses, or organomegaly. .  Lymphatics: Non tender without lymphadenopathy.  Genitourinary: defer Musculoskeletal: Full ROM all peripheral extremities,5/5 strength, and normal  gait. Skin: Warm, dry without rashes, lesions, ecchymosis.  Neuro: Cranial nerves intact, reflexes equal bilaterally. Normal muscle tone, no cerebellar symptoms. Sensation intact.  Psych: Awake and oriented X 3, normal affect, Insight and Judgment appropriate.   EKG: Sinus brady, no ST changes  Quentin MullingAmanda Mikenna Bunkley 7:41 AM

## 2017-03-26 ENCOUNTER — Encounter: Payer: Self-pay | Admitting: Physician Assistant

## 2017-04-22 ENCOUNTER — Other Ambulatory Visit: Payer: Self-pay | Admitting: Physician Assistant

## 2017-05-08 ENCOUNTER — Telehealth: Payer: Self-pay | Admitting: *Deleted

## 2017-05-08 DIAGNOSIS — I10 Essential (primary) hypertension: Secondary | ICD-10-CM

## 2017-05-08 MED ORDER — LOSARTAN POTASSIUM 100 MG PO TABS: 100.0000 mg | ORAL_TABLET | Freq: Every day | ORAL | 0 refills | Status: DC

## 2017-05-08 NOTE — Addendum Note (Signed)
Addended by: [name] R on: 05/08/2017 01:31 PM   Modules accepted: Orders

## 2017-05-08 NOTE — Telephone Encounter (Signed)
Louis Fitzpatrick is asking for a refill on Losartan said he called earlier this week but I dont see a record of that ?  He says he has a appointment 06/03/17 asking for enough to get him through till then.  [facility]

## 2017-06-01 NOTE — Progress Notes (Signed)
Complete Physical   Assessment and Plan: Essential hypertension - continue medications, may be able to cut losartan in half with weight loss, DASH diet, exercise and monitor at home.  - CBC with Differential/Platelet - BASIC METABOLIC PANEL WITH GFR - Hepatic function panel - TSH - Urinalysis, Routine w reflex microscopic (not at [facility]) - Microalbumin / creatinine urine ratio  Hyperlipidemia -continue medications, check lipids, decrease fatty foods, increase activity.  - Lipid panel  Morbid Obesity  long discussion about weight loss, diet, and exercise  Diabetes mellitus without complication Discussed general issues about diabetes pathophysiology and management., Educational material distributed., Suggested low cholesterol diet., Encouraged aerobic exercise., Discussed foot care., Reminded to get yearly retinal exam. - Hemoglobin A1c  Vitamin D deficiency - Vit D  25 hydroxy (rtn osteoporosis monitoring)   Allergy, initial encounter Continue OTC allergy pills  Elevated liver function tests Monitor, decreased LFTs  Routine general medical examination at a health care facility  Medication management - Magnesium   Discussed med's effects and SE's. Screening labs and tests as requested with regular follow-up as recommended.  HPI 32 y.o. male  presents for a complete physical. His blood pressure has been controlled at home, he is on losartan  did not take today, today their BP is BP: (!) 122/98.   He does workout. He denies chest pain, shortness of breath, dizziness.   BMI is Body mass index is 35.32 kg/m., he is working on diet and exercise. Goal weight at this time is 200lbs, waist measurement is 44 inches, goal is below 40.  Wt Readings from Last 3 Encounters:  06/03/17 239 lb 3.2 oz (108.5 kg)  12/04/16 245 lb 12.8 oz (111.5 kg)  12/31/15 244 lb 9.6 oz (110.9 kg)   He is on cholesterol medication and denies myalgias. His cholesterol is at goal, lipitor 80, 1/2  pill every other day The cholesterol last visit was:   Lab Results  Component Value Date   CHOL 164 12/04/2016   HDL 51 12/04/2016   LDLCALC 94 12/04/2016   TRIG 94 12/04/2016   CHOLHDL 3.2 12/04/2016   Lab Results  Component Value Date   ALT 101 (H) 12/11/2016   AST 47 (H) 12/11/2016   ALKPHOS 65 12/31/2015   BILITOT 0.5 12/11/2016   He has been working on diet and exercise for diabetes without complications, he is on metformin and denies paresthesia of the feet, polydipsia and visual disturbances. Last A1C in the office was:  Lab Results  Component Value Date   HGBA1C 7.4 (H) 12/04/2016   Lab Results  Component Value Date   GFRAA 133 12/04/2016   Patient is not on Vitamin D supplement.  Lab Results  Component Value Date   VD25OH 15 (L) 12/31/2015    Current Medications:  Current Outpatient Medications on File Prior to Visit  Medication Sig Dispense Refill  . atorvastatin (LIPITOR) 80 MG tablet TAKE 1 TABLET BY MOUTH EVERY DAY 90 tablet 1  . Cholecalciferol (VITAMIN D PO) Take 5,000 Int'l Units by mouth daily.    Marland Kitchen losartan (COZAAR) 100 MG tablet TAKE 1 TABLET BY MOUTH EVERY DAY 30 tablet 0  . metFORMIN (GLUCOPHAGE-XR) 500 MG 24 hr tablet TAKE 1 TO 2 TABLETS 2 X DAY FOR DIABETES 360 tablet 0   No current facility-administered medications on file prior to visit.    Health Maintenance:  Immunization History  Administered Date(s) Administered  . HPV 9-valent 06/03/2017  . Tdap 01/28/2003, 05/26/2013   TDAP: 2015 Pneumovax:  N/A Flu vaccine: 2017 Zostavax: N/A DEXA: N/A  HPV: will start  Colonoscopy: N/A EGD: N/A Eye doctor: 12/2014, forget doctor  Dentist: No dentist  Medical History:  Past Medical History:  Diagnosis Date  . Allergy   . Hyperlipidemia   . Hypertension   . Obesity (BMI 30-39.9)   . Type II or unspecified type diabetes mellitus without mention of complication, not stated as uncontrolled   . Vitamin D deficiency    Allergies No Known  Allergies  SURGICAL HISTORY He  has a past surgical history that includes Tonsillectomy (2007). FAMILY HISTORY His family history includes Diabetes in his maternal grandfather. SOCIAL HISTORY He  reports that he has never smoked. He has never used smokeless tobacco. He reports that he does not drink alcohol or use drugs. Working in [employer] with [employer] now, having first baby in April 2018, boy, Joesph Fillers, some increased stress with moving, commutes to [place].   Review of Systems  Constitutional: Negative.   HENT: Negative.   Eyes: Negative.   Respiratory: Negative.   Cardiovascular: Negative.   Gastrointestinal: Negative.   Genitourinary: Negative.   Musculoskeletal: Negative.   Skin: Negative.   Neurological: Negative.   Endo/Heme/Allergies: Negative.   Psychiatric/Behavioral: Negative.    Physical Exam: Estimated body mass index is 35.32 kg/m as calculated from the following:   Height as of this encounter:  (1.753 m).   Weight as of this encounter: 239 lb 3.2 oz (108.5 kg). BP (!) 122/98   Pulse 66   Temp 97.6 F (36.4 C)   Resp 20   Ht  (1.753 m)   Wt 239 lb 3.2 oz (108.5 kg)   SpO2 99%   BMI 35.32 kg/m  General Appearance: Well nourished, in no apparent distress. Eyes: PERRLA, EOMs, conjunctiva no swelling or erythema, normal fundi and vessels. Sinuses: No Frontal/maxillary tenderness ENT/Mouth: Ext aud canals clear, normal light reflex with TMs without erythema, bulging. Good dentition. No erythema, swelling, or exudate on post pharynx. Tonsils not swollen or erythematous. Hearing normal.  Neck: Supple, thyroid normal. No bruits Respiratory: Respiratory effort normal, BS equal bilaterally without rales, rhonchi, wheezing or stridor. Cardio: RRR without murmurs, rubs or gallops. Brisk peripheral pulses without edema.  Chest: symmetric, with normal excursions and percussion. Abdomen: Soft, +BS. Non tender, no guarding, rebound, hernias, masses, or  organomegaly. .  Lymphatics: Non tender without lymphadenopathy.  Genitourinary: defer Musculoskeletal: Full ROM all peripheral extremities,5/5 strength, and normal gait. Skin: Warm, dry without rashes, lesions, ecchymosis.  Neuro: Cranial nerves intact, reflexes equal bilaterally. Normal muscle tone, no cerebellar symptoms. Sensation intact.  Psych: Awake and oriented X 3, normal affect, Insight and Judgment appropriate.   EKG: defer [name] 2:55 PM

## 2017-06-02 ENCOUNTER — Other Ambulatory Visit: Payer: Self-pay | Admitting: Physician Assistant

## 2017-06-02 DIAGNOSIS — I10 Essential (primary) hypertension: Secondary | ICD-10-CM

## 2017-06-03 ENCOUNTER — Encounter: Payer: Self-pay | Admitting: Physician Assistant

## 2017-06-03 ENCOUNTER — Ambulatory Visit (INDEPENDENT_AMBULATORY_CARE_PROVIDER_SITE_OTHER): Payer: BLUE CROSS/BLUE SHIELD | Admitting: Physician Assistant

## 2017-06-03 VITALS — BP 122/98 | HR 66 | Temp 97.6°F | Resp 20 | Ht 69.0 in | Wt 239.2 lb

## 2017-06-03 DIAGNOSIS — Z0000 Encounter for general adult medical examination without abnormal findings: Secondary | ICD-10-CM

## 2017-06-03 DIAGNOSIS — Z1329 Encounter for screening for other suspected endocrine disorder: Secondary | ICD-10-CM

## 2017-06-03 DIAGNOSIS — Z1389 Encounter for screening for other disorder: Secondary | ICD-10-CM

## 2017-06-03 DIAGNOSIS — Z131 Encounter for screening for diabetes mellitus: Secondary | ICD-10-CM

## 2017-06-03 DIAGNOSIS — Z23 Encounter for immunization: Secondary | ICD-10-CM

## 2017-06-03 DIAGNOSIS — Z0001 Encounter for general adult medical examination with abnormal findings: Secondary | ICD-10-CM

## 2017-06-03 DIAGNOSIS — E119 Type 2 diabetes mellitus without complications: Secondary | ICD-10-CM

## 2017-06-03 DIAGNOSIS — E785 Hyperlipidemia, unspecified: Secondary | ICD-10-CM

## 2017-06-03 DIAGNOSIS — T7840XA Allergy, unspecified, initial encounter: Secondary | ICD-10-CM

## 2017-06-03 DIAGNOSIS — Z79899 Other long term (current) drug therapy: Secondary | ICD-10-CM

## 2017-06-03 DIAGNOSIS — Z6835 Body mass index (BMI) 35.0-35.9, adult: Secondary | ICD-10-CM

## 2017-06-03 DIAGNOSIS — I10 Essential (primary) hypertension: Secondary | ICD-10-CM

## 2017-06-03 DIAGNOSIS — R7989 Other specified abnormal findings of blood chemistry: Secondary | ICD-10-CM

## 2017-06-03 DIAGNOSIS — R945 Abnormal results of liver function studies: Secondary | ICD-10-CM

## 2017-06-03 DIAGNOSIS — E559 Vitamin D deficiency, unspecified: Secondary | ICD-10-CM

## 2017-06-03 DIAGNOSIS — Z13220 Encounter for screening for lipoid disorders: Secondary | ICD-10-CM

## 2017-06-03 NOTE — Patient Instructions (Addendum)
Use a dropper or use a cap to put peroxide, olive oil,mineral oil or canola oil in the effected ear- 2-3 times a week. Let it soak for 20-30 min then you can take a shower or use a baby bulb with warm water to wash out the ear wax.  Do not use Qtips  Will start HPV vaccines today   Vitamin D goal is between 60-80  Please make sure that you are taking your Vitamin D as directed.   It is very important as a natural anti-inflammatory   helping hair, skin, and nails, as well as reducing stroke and heart attack risk.   It helps your bones and helps with mood.  We want you on at least 5000 IU daily  It also decreases numerous cancer risks so please take it as directed.   Low Vit D is associated with a 200-300% higher risk for CANCER   and 200-300% higher risk for HEART   ATTACK  &  STROKE.    .....................................Marland Kitchen  It is also associated with higher death rate at younger ages,   autoimmune diseases like Rheumatoid arthritis, Lupus, Multiple Sclerosis.     Also many other serious conditions, like depression, Alzheimer's  Dementia, infertility, muscle aches, fatigue, fibromyalgia - just to name a few.  +++++++++++++++++++  Can get liquid vitamin D from [place]  OR here in [place] at  [facility] alternatives [address] [place], [place] [address] Or you can try earth fare  Check out  Mini habits for weight loss book   Goal weight at this time is 200lbs, waist measurement is 44 inches, goal is below 40.  Wt Readings from Last 3 Encounters:  06/03/17 239 lb 3.2 oz (108.5 kg)  12/04/16 245 lb 12.8 oz (111.5 kg)  12/31/15 244 lb 9.6 oz (110.9 kg)   BMI Readings from Last 1 Encounters:  06/03/17 35.32 kg/m    2 apps for tracking food is myfitness pal  loseit OR can take picture of your food  Intermittent fasting is more about strategy than starvation. It's meant to reset your body in different ways, hopefully with fitness and nutrition changes as a  result.  Like any big switchover, though, results may vary when it comes down to the individual level. What works for your friends may not work for you, or vice versa. That's why it's helpful to play around with variations on intermittent fasting and healthy habits and find what works best for you.  WHAT IS INTERMITTENT FASTING AND WHY DO IT?  Intermittent fasting doesn't involve specific foods, but rather, a strict schedule regarding when you eat. Also called "time-restricted eating," the tactic has been praised for its contribution to weight loss, improved body composition, and decreased cravings. Preliminary research also suggests it may be beneficial for glucose tolerance, hormone regulation, better muscle mass and lower body fat.  Part of its appeal is the simplicity of the effort. Unlike some other trends, there's no calculations to intermittent fasting.  You simply eat within a certain block of time, usually a window of 8-10 hours. In the other big block of time - about 14-16 hours, including when you're asleep - you don't eat anything, not even snacks. You can drink water, coffee, tea or any other beverage that doesn't have calories.  For example, if you like having a late dinner, you might skip breakfast and have your first meal at noon and your last meal of the day at 8 p.m., and then not eat until noon  again the next day.  IDEAS FOR GETTING STARTED  If you're new to the strategy, it may be helpful to eat within the typical circadian rhythm and keep eating within daylight hours. This can be especially beneficial if you're looking at intermittent fasting for weight-loss goals.  So first try only eating between 12pm to 8pm.  Outside of this time you may have water, black coffee, and hot tea. You may not eat it drink anything that has carbs, sugars, OR artificial sugars like diet soda.   Like any major eating and fitness shift, it can take time to find the perfect fit, so don't be afraid  to experiment with different options - including ditching intermittent fasting altogether if it's simply not for you. But if it is, you may be surprised by some of the benefits that come along with the strategy.   Diet soda leads to weight gain.  We recently discovered that the artifical sugar in the soda stops an enzyme in your stomach that is suppose to signal that your brain is full. So patients that drink a lot of diet soda will never feel full and tend to over eat. So please cut back on diet soda and it can help with your weight loss.    Veggies are great because you can eat a ton! They are low in calories, great to fill you up, and have a ton of vitamins, minerals, and protein.      8 Critical Weight-Loss Tips That Aren't Diet and Exercise  1. STARVE THE DISTRACTIONS  All too often when we eat, we're also multitasking: watching TV, answering emails, scrolling through social media. These habits are detrimental to having a strong, clear, healthy relationship with food, and they can hinder our ability to make dietary changes.  In order to truly focus on what you're eating, how much you're eating, why you're eating those specific foods and, most importantly, how those foods make you feel, you need to starve the distractions. That means when you eat, just eat. Focus on your food, the process it went through to end up on your plate, where it came from and how it nourishes you. With this technique, you're more likely to finish a meal feeling satiated.  2.  CONSIDER WHAT YOU'RE NOT WILLING TO DO  This might sound counterintuitive, but it can help provide a "why" when motivation is waning. Declare, in writing, what you are unwilling to do, for example "I am unwilling to be the old dad who cannot play sports with my children".  So consider what you're not willing to accept, write it down, and keep it at the ready.  3.  STOP LABELING FOOD "GOOD" AND "BAD"  You've probably heard someone say  they ate something "bad." Maybe you've even said it yourself.  The trouble with 'bad' foods isn't that they'll send you to the grave after a bite or two. The trouble comes when we eat excessive portions of really calorie-dense foods meal after meal, day after day.  Instead of labeling foods as good or bad, think about which foods you can eat a lot of, and which ones you should just eat a little of. Then, plan ways to eat the foods you really like in portions that fit with your overall goals. A good example of this would be having a slice of pizza alongside a club salad with chicken breast, avocado and a bit of dressing. This is vastly different than 3 slices of pizza, 4 breadsticks  with cheese sauce and half of a liter of regular soda.  4.  BRUSH YOUR TEETH AFTER YOU EAT  Getting your mindset in order is important, but sometimes small habits can make a big difference. After eating, you still have the taste of food in their mouth, which often causes people to eat more even if they are full or engage in a nibble or two of dessert.  Brushing your teeth will remove the taste of food from your mouth, and the clean, minty freshness will serve as a cue that mealtime is over.  5.  FOCUS ON CROWDING NOT CUTTING  The most common first step during 'dieting' is to cut. We cut our portion sizes down, we cut out 'bad' foods, we cut out entire food groups. This act of cutting puts [place] and our minds into scarcity mode.  When something is off-limits, even if you're able to avoid it for a while, you could end up bingeing on it later because you've gone so long without it. So, instead of cutting, focus on crowding. If you crowd your plate and fill it up with more foods like veggies and protein, it simply allows less room for the other stuff. In other words, shift your focus away from what you can't eat, and celebrate the foods that will help you reach your goals.  6.  TAKE TRACKING A STEP FURTHER  Track what you eat,  when you ate it, how much you ate and how that food made you feel. Being completely honest with yourself and writing down every single thing that passes through your lips will help you start to notice that maybe you actually do snack, possibly take in more sugar than you thought, eat when you're bored rather than just hungry or maybe that you have a habit of snacking before bed while watching TV.  The difference from simply tracking your food intake is you're taking into account how food makes you feel, as well as what you're doing while you're eating. This is about becoming more mindful of what, when and why you eat.  7.  PRIORITIZE GOOD SLEEP  One of the strongest risk factors for being overweight is poor sleep. When you're feeling tired, you're more likely to choose unhealthy comfort foods and to skip your workout. Additionally, sleep deprivation may slow down your metabolism. Burnett Kanaris! Therefore, sleeping 7-8 hours per night can help with weight loss without having to change your diet or increase your physical activity. And if you feel you snore and still wake up tired, talk with me about sleep apnea.  8.  SET ASIDE TIME TO DISCONNECT  Just get out there. Disconnect from the electronics and connect to the elements. Not only will this help reduce stress (a major factor in weight gain) by giving your mind a break from the constant stimulation we've all become so accustomed to, but it may also reprogram your brain to connect with yourself and what you're feeling.

## 2017-06-04 LAB — COMPLETE METABOLIC PANEL WITH GFR
AG Ratio: 1.8 (calc) (ref 1.0–2.5)
ALT: 96 U/L — ABNORMAL HIGH (ref 9–46)
AST: 40 U/L (ref 10–40)
Albumin: 4.5 g/dL (ref 3.6–5.1)
Alkaline phosphatase (APISO): 91 U/L (ref 40–115)
BUN: 12 mg/dL (ref 7–25)
CO2: 30 mmol/L (ref 20–32)
Calcium: 10.1 mg/dL (ref 8.6–10.3)
Chloride: 100 mmol/L (ref 98–110)
Creat: 0.92 mg/dL (ref 0.60–1.35)
GFR, Est African American: 127 mL/min/{1.73_m2} (ref >=60)
GFR, Est Non African American: 110 mL/min/{1.73_m2} (ref >=60)
Globulin: 2.5 g/dL (calc) (ref 1.9–3.7)
Glucose, Bld: 184 mg/dL — ABNORMAL HIGH (ref 65–99)
Potassium: 3.9 mmol/L (ref 3.5–5.3)
Sodium: 139 mmol/L (ref 135–146)
Total Bilirubin: 0.6 mg/dL (ref 0.2–1.2)
Total Protein: 7 g/dL (ref 6.1–8.1)

## 2017-06-04 LAB — LIPID PANEL
Cholesterol: 125 mg/dL (ref <200)
HDL: 32 mg/dL — ABNORMAL LOW (ref >40)
LDL Cholesterol (Calc): 75 mg/dL (calc)
Non-HDL Cholesterol (Calc): 93 mg/dL (calc) (ref <130)
Total CHOL/HDL Ratio: 3.9 (calc) (ref <5.0)
Triglycerides: 101 mg/dL (ref <150)

## 2017-06-04 LAB — CBC WITH DIFFERENTIAL/PLATELET
Basophils Absolute: 29 cells/uL (ref 0–200)
Basophils Relative: 0.5 %
Eosinophils Absolute: 68 cells/uL (ref 15–500)
Eosinophils Relative: 1.2 %
HCT: 41.6 % (ref 38.5–50.0)
Hemoglobin: 14.5 g/dL (ref 13.2–17.1)
Lymphs Abs: 2576 cells/uL (ref 850–3900)
MCH: 28.5 pg (ref 27.0–33.0)
MCHC: 34.9 g/dL (ref 32.0–36.0)
MCV: 81.7 fL (ref 80.0–100.0)
MPV: 11.3 fL (ref 7.5–12.5)
Monocytes Relative: 6.3 %
Neutro Abs: 2668 cells/uL (ref 1500–7800)
Neutrophils Relative %: 46.8 %
Platelets: 326 10*3/uL (ref 140–400)
RBC: 5.09 10*6/uL (ref 4.20–5.80)
RDW: 12.7 % (ref 11.0–15.0)
Total Lymphocyte: 45.2 %
WBC mixed population: 359 cells/uL (ref 200–950)
WBC: 5.7 10*3/uL (ref 3.8–10.8)

## 2017-06-04 LAB — URINALYSIS, ROUTINE W REFLEX MICROSCOPIC
Bacteria, UA: NONE SEEN /HPF
Bilirubin Urine: NEGATIVE
Hgb urine dipstick: NEGATIVE
Hyaline Cast: NONE SEEN /LPF
Ketones, ur: NEGATIVE
Leukocytes, UA: NEGATIVE
Nitrite: NEGATIVE
RBC / HPF: NONE SEEN /HPF (ref 0–2)
Specific Gravity, Urine: 1.031 (ref 1.001–1.03)
Squamous Epithelial / LPF: NONE SEEN /HPF (ref <=5)
WBC, UA: NONE SEEN /HPF (ref 0–5)
pH: 6 (ref 5.0–8.0)

## 2017-06-04 LAB — MICROALBUMIN / CREATININE URINE RATIO
Creatinine, Urine: 238 mg/dL (ref 20–320)
Microalb Creat Ratio: 27 mcg/mg creat (ref <30)
Microalb, Ur: 6.5 mg/dL

## 2017-06-04 LAB — HEMOGLOBIN A1C
Hgb A1c MFr Bld: 12.6 % of total Hgb — ABNORMAL HIGH (ref <5.7)
Mean Plasma Glucose: 315 (calc)
eAG (mmol/L): 17.4 (calc)

## 2017-06-04 LAB — TSH: TSH: 1.49 mIU/L (ref 0.40–4.50)

## 2017-06-05 ENCOUNTER — Encounter: Payer: Self-pay | Admitting: Physician Assistant

## 2017-06-05 DIAGNOSIS — I10 Essential (primary) hypertension: Secondary | ICD-10-CM

## 2017-06-07 ENCOUNTER — Other Ambulatory Visit: Payer: Self-pay | Admitting: Physician Assistant

## 2017-06-07 MED ORDER — DAPAGLIFLOZIN PROPANEDIOL 10 MG PO TABS: 10.0000 mg | ORAL_TABLET | Freq: Every day | ORAL | 3 refills | Status: DC

## 2017-06-07 MED ORDER — LOSARTAN POTASSIUM 100 MG PO TABS: 100.0000 mg | ORAL_TABLET | Freq: Every day | ORAL | 2 refills | Status: DC

## 2017-07-26 ENCOUNTER — Other Ambulatory Visit: Payer: Self-pay | Admitting: Internal Medicine

## 2017-08-06 ENCOUNTER — Ambulatory Visit: Payer: Self-pay

## 2017-08-14 ENCOUNTER — Ambulatory Visit (INDEPENDENT_AMBULATORY_CARE_PROVIDER_SITE_OTHER): Payer: BLUE CROSS/BLUE SHIELD

## 2017-08-14 VITALS — Ht 69.0 in

## 2017-08-14 DIAGNOSIS — Z23 Encounter for immunization: Secondary | ICD-10-CM

## 2017-08-14 NOTE — Progress Notes (Signed)
PT REPORTS FOR 2ND HPV. WAS GIVEN IN LEFT ARM W/O CONCERNS.

## 2017-09-10 ENCOUNTER — Ambulatory Visit: Payer: Self-pay | Admitting: Physician Assistant

## 2017-09-15 ENCOUNTER — Other Ambulatory Visit: Payer: Self-pay | Admitting: Physician Assistant

## 2017-09-15 DIAGNOSIS — I10 Essential (primary) hypertension: Secondary | ICD-10-CM

## 2017-11-08 ENCOUNTER — Other Ambulatory Visit: Payer: Self-pay | Admitting: Physician Assistant

## 2017-11-13 ENCOUNTER — Ambulatory Visit: Payer: Self-pay | Admitting: Physician Assistant

## 2017-12-10 ENCOUNTER — Ambulatory Visit: Payer: Self-pay

## 2018-01-07 ENCOUNTER — Other Ambulatory Visit: Payer: Self-pay | Admitting: Internal Medicine

## 2018-01-13 NOTE — Progress Notes (Deleted)
Assessment and Plan:   Essential hypertension - continue medications, DASH diet, exercise and monitor at home. Call if greater than 130/80.  -     CBC with Differential/Platelet -     BASIC METABOLIC PANEL WITH GFR -     Hepatic function panel -     TSH  Diabetes mellitus without complication (HCC) Discussed general issues about diabetes pathophysiology and management., Educational material distributed., Suggested low cholesterol diet., Encouraged aerobic exercise., Discussed foot care., Reminded to get yearly retinal exam. -     Hemoglobin A1c  Hyperlipidemia, unspecified hyperlipidemia type -continue medications, check lipids, decrease fatty foods, increase activity.  -     Lipid panel  Morbid obesity (HCC) - long discussion about weight loss, diet, and exercise  Medication management -     Magnesium  Vitamin D deficiency Get on supplement  Continue diet and meds as discussed. Further disposition pending results of labs. Discussed med's effects and SE's.   Future Appointments  Date Time Provider Department Center  01/15/2018 11:30 AM Quentin Mullingollier, Nathifa Ritthaler, PA-C [facility] None  06/08/2018  2:00 PM Quentin Mullingollier, Katryna Tschirhart, PA-C [facility] None    HPI 32 y.o. male  presents for 3 month follow up with hypertension, hyperlipidemia, diabetes and vitamin D. His blood pressure is not checked at home , losartan 100mg , today their BP is  . He does not workout, due to stress, has 257 month old, braden.  He denies chest pain, shortness of breath, dizziness.  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 His cholesterol is at goal. The cholesterol was:  06/03/2017: Cholesterol 125; HDL 32; LDL Cholesterol (Calc) 75; Triglycerides 101 He has been working on diet and exercise for Diabetes without complications, he is not on bASA, he is on ACE/ARB, and denies  paresthesia of the feet, polydipsia, polyuria and visual disturbances. Last A1C  was: 06/03/2017: Hgb A1c MFr Bld 12.6  Lab Results  Component Value Date   GFRAA 127 06/03/2017  Patient is on Vitamin D supplement. No results found for requested labs within last 8760 hours. BMI is There is no height or weight on file to calculate BMI., he is working on diet and exercise.  Wt Readings from Last 3 Encounters:  06/03/17 239 lb 3.2 oz (108.5 kg)  12/04/16 245 lb 12.8 oz (111.5 kg)  12/31/15 244 lb 9.6 oz (110.9 kg)   Current Medications:  Current Outpatient Medications on File Prior to Visit  Medication Sig Dispense Refill  . atorvastatin (LIPITOR) 80 MG tablet TAKE 1 TABLET BY MOUTH EVERY DAY 90 tablet 1  . Cholecalciferol (VITAMIN D PO) Take 5,000 Int'l Units by mouth daily.    Marland Kitchen. FARXIGA 10 MG TABS tablet TAKE 1 TABLET BY MOUTH EVERY DAY 30 tablet 3  . losartan (COZAAR) 100 MG tablet TAKE 1 TABLET BY MOUTH EVERY DAY 90 tablet 0  . metFORMIN (GLUCOPHAGE-XR) 500 MG 24 hr tablet TAKE 1 TO 2 TABLETS 2 X DAY FOR DIABETES 360 tablet 0   No current facility-administered medications on file prior to visit.    Medical History:  Past Medical History:  Diagnosis Date  . Allergy   . Hyperlipidemia   . Hypertension   . Obesity (BMI 30-39.9)   . Type II or unspecified type diabetes mellitus without mention of complication, not stated as uncontrolled   . Vitamin D deficiency    Allergies: No Known Allergies   Review of Systems:  Review  of Systems  Constitutional: Negative.   HENT: Negative.   Eyes: Negative.   Respiratory: Negative.   Cardiovascular: Negative.   Gastrointestinal: Negative.   Genitourinary: Negative.   Musculoskeletal: Negative.   Skin: Negative.   Neurological: Negative.   Endo/Heme/Allergies: Negative.   Psychiatric/Behavioral: Negative.     Family history- Review and unchanged Social history- Review and unchanged Physical Exam: There were no vitals taken for this visit. Wt Readings from Last 3 Encounters:  06/03/17 239 lb 3.2 oz (108.5 kg)   12/04/16 245 lb 12.8 oz (111.5 kg)  12/31/15 244 lb 9.6 oz (110.9 kg)   General Appearance: Well nourished, in no apparent distress. Eyes: PERRLA, EOMs, conjunctiva no swelling or erythema Sinuses: No Frontal/maxillary tenderness ENT/Mouth: Ext aud canals clear, TMs without erythema, bulging. No erythema, swelling, or exudate on post pharynx.  Tonsils not swollen or erythematous. Hearing normal.  Neck: Supple, thyroid normal.  Respiratory: Respiratory effort normal, BS equal bilaterally without rales, rhonchi, wheezing or stridor.  Cardio: RRR with no MRGs. Brisk peripheral pulses without edema.  Abdomen: Soft, + BS.  Non tender, no guarding, rebound, hernias, masses. Lymphatics: Non tender without lymphadenopathy.  Musculoskeletal: Full ROM, 5/5 strength, normal gait.  Skin: Warm, dry without rashes, lesions, ecchymosis.  Neuro: Cranial nerves intact. No cerebellar symptoms. Sensation intact.  Psych: Awake and oriented X 3, normal affect, Insight and Judgment appropriate.    [name], PA-C 1:42 PM [facility]

## 2018-01-15 ENCOUNTER — Ambulatory Visit: Payer: Self-pay | Admitting: Physician Assistant

## 2018-01-15 DIAGNOSIS — E119 Type 2 diabetes mellitus without complications: Secondary | ICD-10-CM | POA: Diagnosis not present

## 2018-01-15 LAB — HM DIABETES EYE EXAM

## 2018-01-20 NOTE — Progress Notes (Deleted)
Assessment and Plan:   Essential hypertension - continue medications, DASH diet, exercise and monitor at home. Call if greater than 130/80.  -     CBC with Differential/Platelet -     BASIC METABOLIC PANEL WITH GFR -     Hepatic function panel -     TSH  Diabetes mellitus without complication (HCC) Discussed general issues about diabetes pathophysiology and management., Educational material distributed., Suggested low cholesterol diet., Encouraged aerobic exercise., Discussed foot care., Reminded to get yearly retinal exam. -     Hemoglobin A1c  Hyperlipidemia, unspecified hyperlipidemia type -continue medications, check lipids, decrease fatty foods, increase activity.  -     Lipid panel  Morbid obesity (HCC) - long discussion about weight loss, diet, and exercise  Medication management -     Magnesium  Vitamin D deficiency Get on supplement  Continue diet and meds as discussed. Further disposition pending results of labs. Discussed med's effects and SE's.   Future Appointments  Date Time Provider Department Center  01/21/2018  3:45 PM Quentin MullingCollier, Letonia Stead, PA-C [facility] None  06/08/2018  2:00 PM Quentin Mullingollier, Reagen Haberman, PA-C [facility] None    HPI 32 y.o. male  presents for 3 month follow up with hypertension, hyperlipidemia, diabetes and vitamin D. His blood pressure is not checked at home , losartan 100mg , today their BP is  . He does not workout, due to stress, has 687 month old, braden.  He denies chest pain, shortness of breath, dizziness.  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 His cholesterol is at goal. The cholesterol was:  06/03/2017: Cholesterol 125; HDL 32; LDL Cholesterol (Calc) 75; Triglycerides 101 He has been working on diet and exercise for Diabetes without complications, he is not on bASA, he is on ACE/ARB, and denies  paresthesia of the feet, polydipsia, polyuria and visual disturbances. Last A1C  was: 06/03/2017: Hgb A1c MFr Bld 12.6  Lab Results  Component Value Date   GFRAA 127 06/03/2017  Patient is on Vitamin D supplement. No results found for requested labs within last 8760 hours. BMI is There is no height or weight on file to calculate BMI., he is working on diet and exercise.  Wt Readings from Last 3 Encounters:  06/03/17 239 lb 3.2 oz (108.5 kg)  12/04/16 245 lb 12.8 oz (111.5 kg)  12/31/15 244 lb 9.6 oz (110.9 kg)   Current Medications:  Current Outpatient Medications on File Prior to Visit  Medication Sig Dispense Refill  . atorvastatin (LIPITOR) 80 MG tablet TAKE 1 TABLET BY MOUTH EVERY DAY 90 tablet 1  . Cholecalciferol (VITAMIN D PO) Take 5,000 Int'l Units by mouth daily.    Marland Kitchen. FARXIGA 10 MG TABS tablet TAKE 1 TABLET BY MOUTH EVERY DAY 30 tablet 3  . losartan (COZAAR) 100 MG tablet TAKE 1 TABLET BY MOUTH EVERY DAY 90 tablet 0  . metFORMIN (GLUCOPHAGE-XR) 500 MG 24 hr tablet TAKE 1 TO 2 TABLETS 2 X DAY FOR DIABETES 360 tablet 0   No current facility-administered medications on file prior to visit.    Medical History:  Past Medical History:  Diagnosis Date  . Allergy   . Hyperlipidemia   . Hypertension   . Obesity (BMI 30-39.9)   . Type II or unspecified type diabetes mellitus without mention of complication, not stated as uncontrolled   . Vitamin D deficiency    Allergies: No Known Allergies   Review of Systems:  Review of Systems  Constitutional: Negative.   HENT: Negative.   Eyes: Negative.   Respiratory: Negative.   Cardiovascular: Negative.   Gastrointestinal: Negative.   Genitourinary: Negative.   Musculoskeletal: Negative.   Skin: Negative.   Neurological: Negative.   Endo/Heme/Allergies: Negative.   Psychiatric/Behavioral: Negative.     Family history- Review and unchanged Social history- Review and unchanged Physical Exam: There were no vitals taken for this visit. Wt Readings from Last 3 Encounters:  06/03/17 239 lb 3.2 oz (108.5 kg)   12/04/16 245 lb 12.8 oz (111.5 kg)  12/31/15 244 lb 9.6 oz (110.9 kg)   General Appearance: Well nourished, in no apparent distress. Eyes: PERRLA, EOMs, conjunctiva no swelling or erythema Sinuses: No Frontal/maxillary tenderness ENT/Mouth: Ext aud canals clear, TMs without erythema, bulging. No erythema, swelling, or exudate on post pharynx.  Tonsils not swollen or erythematous. Hearing normal.  Neck: Supple, thyroid normal.  Respiratory: Respiratory effort normal, BS equal bilaterally without rales, rhonchi, wheezing or stridor.  Cardio: RRR with no MRGs. Brisk peripheral pulses without edema.  Abdomen: Soft, + BS.  Non tender, no guarding, rebound, hernias, masses. Lymphatics: Non tender without lymphadenopathy.  Musculoskeletal: Full ROM, 5/5 strength, normal gait.  Skin: Warm, dry without rashes, lesions, ecchymosis.  Neuro: Cranial nerves intact. No cerebellar symptoms. Sensation intact.  Psych: Awake and oriented X 3, normal affect, Insight and Judgment appropriate.    Quentin MullingAmanda Maxim Bedel, PA-C 8:06 PM [facility]

## 2018-01-21 ENCOUNTER — Ambulatory Visit: Payer: Self-pay | Admitting: Physician Assistant

## 2018-01-22 NOTE — Progress Notes (Signed)
Assessment and Plan:   Essential hypertension - continue medications, DASH diet, exercise and monitor at home. Call if greater than 130/80.  START BACK ON LOSARTAN -     CBC with Differential/Platelet -     BASIC METABOLIC PANEL WITH GFR -     Hepatic function panel -     TSH  Diabetes mellitus without complication (HCC) Discussed general issues about diabetes pathophysiology and management., Educational material distributed., Suggested low cholesterol diet., Encouraged aerobic exercise., Discussed foot care., Reminded to get yearly retinal exam. -     Hemoglobin A1c - CONTINUE METFORMIN WILL START TRULICITY, PENDING A1C MAY RESTART FARXIGA  Hyperlipidemia, unspecified hyperlipidemia type -continue medications, check lipids, decrease fatty foods, increase activity.  -     Lipid panel  Morbid obesity (HCC) - long discussion about weight loss, diet, and exercise VERY CLOSE FOLLOW UP 1 MONTH BRING DIARY CLEAN OUT EARS  Medication management -     Magnesium  Vitamin D deficiency Get on supplement  Continue diet and meds as discussed. Further disposition pending results of labs. Discussed med's effects and SE's.   Future Appointments  Date Time Provider Department Center  02/26/2018 11:30 AM Quentin Mullingollier, Emberlin Verner, PA-C [facility] None  03/26/2018 11:30 AM Quentin Mullingollier, Helaine Yackel, PA-C [facility] None  06/08/2018  2:00 PM Quentin Mullingollier, [place] Pettengill, PA-C [facility] None    HPI 32 y.o. male  presents for 3 month follow up with hypertension, hyperlipidemia, diabetes and vitamin D.  His blood pressure is not checked at home, today their BP is BP: (!) 138/100.  He does not workout, due to stress, has 331.1095  month old, braden. He has been very stressed, with work, lost friend a few days ago.   He denies chest pain, shortness of breath, dizziness.  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 and denies myalgias. His  cholesterol is at goal. The cholesterol was:  06/03/2017: Cholesterol 125; HDL 32; LDL Cholesterol (Calc) 75; Triglycerides 101   He has been working on diet and exercise for Diabetes without complications, he is not on bASA, he is on ACE/ARB, he is on 2 pills a day, and denies  paresthesia of the feet, polydipsia, polyuria and visual disturbances. Last A1C was: 06/03/2017: Hgb A1c MFr Bld 12.6    Lab Results  Component Value Date   GFRAA 127 06/03/2017   BMI is Body mass index is 36.71 kg/m., he is working on diet and exercise.  Wt Readings from Last 3 Encounters:  01/25/18 248 lb 9.6 oz (112.8 kg)  06/03/17 239 lb 3.2 oz (108.5 kg)  12/04/16 245 lb 12.8 oz (111.5 kg)   Current Medications:  Current Outpatient Medications on File Prior to Visit  Medication Sig Dispense Refill  . atorvastatin (LIPITOR) 80 MG tablet TAKE 1 TABLET BY MOUTH EVERY DAY 90 tablet 1  . Cholecalciferol (VITAMIN D PO) Take 5,000 Int'l Units by mouth daily.    . metFORMIN (GLUCOPHAGE-XR) 500 MG 24 hr tablet TAKE 1 TO 2 TABLETS 2 X DAY FOR DIABETES 360 tablet 0   No current facility-administered medications on file prior to visit.    Medical History:  Past Medical History:  Diagnosis Date  . Allergy   . Hyperlipidemia   . Hypertension   . Obesity (BMI 30-39.9)   . Type II or unspecified type diabetes mellitus without mention of complication, not stated as uncontrolled   .  Vitamin D deficiency    Allergies: No Known Allergies   Review of Systems:  Review of Systems  Constitutional: Negative.   HENT: Negative.   Eyes: Negative.   Respiratory: Negative.   Cardiovascular: Negative.   Gastrointestinal: Negative.   Genitourinary: Negative.   Musculoskeletal: Negative.   Skin: Negative.   Neurological: Negative.   Endo/Heme/Allergies: Negative.   Psychiatric/Behavioral: Negative.     Family history- Review and unchanged Social history- Review and unchanged Physical Exam: BP (!) 138/100   Pulse 68    Temp (!) 97.5 F (36.4 C)   Ht 5\' 9"  (1.753 m)   Wt 248 lb 9.6 oz (112.8 kg)   SpO2 98%   BMI 36.71 kg/m  Wt Readings from Last 3 Encounters:  01/25/18 248 lb 9.6 oz (112.8 kg)  06/03/17 239 lb 3.2 oz (108.5 kg)  12/04/16 245 lb 12.8 oz (111.5 kg)   General Appearance: Well nourished, in no apparent distress. Eyes: PERRLA, EOMs, conjunctiva no swelling or erythema Sinuses: No Frontal/maxillary tenderness ENT/Mouth: Ext aud canals clear, TMs without erythema, bulging. No erythema, swelling, or exudate on post pharynx.  Tonsils not swollen or erythematous. Hearing normal.  Neck: Supple, thyroid normal.  Respiratory: Respiratory effort normal, BS equal bilaterally without rales, rhonchi, wheezing or stridor.  Cardio: RRR with no MRGs. Brisk peripheral pulses without edema.  Abdomen: Soft, + BS.  Non tender, no guarding, rebound, hernias, masses. Lymphatics: Non tender without lymphadenopathy.  Musculoskeletal: Full ROM, 5/5 strength, normal gait.  Skin: Warm, dry without rashes, lesions, ecchymosis.  Neuro: Cranial nerves intact. No cerebellar symptoms. Sensation intact.  Psych: Awake and oriented X 3, normal affect, Insight and Judgment appropriate.    Quentin MullingAmanda Karry Causer, PA-C 12:48 PM [facility]

## 2018-01-25 ENCOUNTER — Ambulatory Visit (INDEPENDENT_AMBULATORY_CARE_PROVIDER_SITE_OTHER): Payer: BLUE CROSS/BLUE SHIELD | Admitting: Physician Assistant

## 2018-01-25 ENCOUNTER — Encounter: Payer: Self-pay | Admitting: Physician Assistant

## 2018-01-25 VITALS — BP 138/100 | HR 68 | Temp 97.5°F | Ht 69.0 in | Wt 248.6 lb

## 2018-01-25 DIAGNOSIS — E785 Hyperlipidemia, unspecified: Secondary | ICD-10-CM

## 2018-01-25 DIAGNOSIS — E119 Type 2 diabetes mellitus without complications: Secondary | ICD-10-CM

## 2018-01-25 DIAGNOSIS — I10 Essential (primary) hypertension: Secondary | ICD-10-CM | POA: Diagnosis not present

## 2018-01-25 DIAGNOSIS — Z23 Encounter for immunization: Secondary | ICD-10-CM

## 2018-01-25 MED ORDER — DAPAGLIFLOZIN PROPANEDIOL 10 MG PO TABS: 10.0000 mg | ORAL_TABLET | Freq: Every day | ORAL | 3 refills | Status: DC

## 2018-01-25 MED ORDER — LOSARTAN POTASSIUM 100 MG PO TABS: 100.0000 mg | ORAL_TABLET | Freq: Every day | ORAL | 0 refills | Status: DC

## 2018-01-25 NOTE — Patient Instructions (Signed)
Check out  Mini habits for weight loss book  2 apps for tracking food is myfitness pal  loseit OR can take picture of your food  Keep food diary x 1 month  Google mindful eating and here are some tips and tricks below.   Rate your hunger before you eat on a scale of 1-10, try to eat closer to a 6 or higher. And if you are at below that, why are you eating? Slow down and listen to your body.         When it comes to diets, agreement about the perfect plan isn't easy to find, even among the experts. Experts at the [employer] developed an idea known as the Healthy Eating Plate. Just imagine a plate divided into logical, healthy portions.  The emphasis is on diet quality:  Load up on vegetables and fruits - one-half of your plate: Aim for color and variety, and remember that potatoes don't count.  Go for whole grains - one-quarter of your plate: Whole wheat, barley, wheat berries, quinoa, oats, brown rice, and foods made with them. If you want pasta, go with whole wheat pasta.  Protein power - one-quarter of your plate: Fish, chicken, beans, and nuts are all healthy, versatile protein sources. Limit red meat.  The diet, however, does go beyond the plate, offering a few other suggestions.  Use healthy plant oils, such as olive, canola, soy, corn, sunflower and peanut. Check the labels, and avoid partially hydrogenated oil, which have unhealthy trans fats.  If you're thirsty, drink water. Coffee and tea are good in moderation, but skip sugary drinks and limit milk and dairy products to one or two daily servings.  The type of carbohydrate in the diet is more important than the amount. Some sources of carbohydrates, such as vegetables, fruits, whole grains, and beans-are healthier than others.  Finally, stay active.

## 2018-01-26 LAB — CBC WITH DIFFERENTIAL/PLATELET
Absolute Monocytes: 288 cells/uL (ref 200–950)
Basophils Absolute: 22 cells/uL (ref 0–200)
Basophils Relative: 0.6 %
Eosinophils Absolute: 40 cells/uL (ref 15–500)
Eosinophils Relative: 1.1 %
HCT: 46.8 % (ref 38.5–50.0)
Hemoglobin: 16.1 g/dL (ref 13.2–17.1)
Lymphs Abs: 1814 cells/uL (ref 850–3900)
MCH: 29 pg (ref 27.0–33.0)
MCHC: 34.4 g/dL (ref 32.0–36.0)
MCV: 84.3 fL (ref 80.0–100.0)
MPV: 10.8 fL (ref 7.5–12.5)
Monocytes Relative: 8 %
Neutro Abs: 1436 cells/uL — ABNORMAL LOW (ref 1500–7800)
Neutrophils Relative %: 39.9 %
Platelets: 260 10*3/uL (ref 140–400)
RBC: 5.55 10*6/uL (ref 4.20–5.80)
RDW: 12.7 % (ref 11.0–15.0)
Total Lymphocyte: 50.4 %
WBC: 3.6 10*3/uL — ABNORMAL LOW (ref 3.8–10.8)

## 2018-01-26 LAB — COMPLETE METABOLIC PANEL WITH GFR
AG Ratio: 1.6 (calc) (ref 1.0–2.5)
ALT: 77 U/L — ABNORMAL HIGH (ref 9–46)
AST: 35 U/L (ref 10–40)
Albumin: 4.7 g/dL (ref 3.6–5.1)
Alkaline phosphatase (APISO): 88 U/L (ref 40–115)
BUN: 13 mg/dL (ref 7–25)
CO2: 26 mmol/L (ref 20–32)
Calcium: 10.5 mg/dL — ABNORMAL HIGH (ref 8.6–10.3)
Chloride: 101 mmol/L (ref 98–110)
Creat: 0.99 mg/dL (ref 0.60–1.35)
GFR, Est African American: 116 mL/min/{1.73_m2} (ref >=60)
GFR, Est Non African American: 100 mL/min/{1.73_m2} (ref >=60)
Globulin: 2.9 g/dL (calc) (ref 1.9–3.7)
Glucose, Bld: 246 mg/dL — ABNORMAL HIGH (ref 65–99)
Potassium: 4.8 mmol/L (ref 3.5–5.3)
Sodium: 139 mmol/L (ref 135–146)
Total Bilirubin: 0.5 mg/dL (ref 0.2–1.2)
Total Protein: 7.6 g/dL (ref 6.1–8.1)

## 2018-01-26 LAB — LIPID PANEL
Cholesterol: 201 mg/dL — ABNORMAL HIGH (ref <200)
HDL: 50 mg/dL (ref >40)
LDL Cholesterol (Calc): 126 mg/dL (calc) — ABNORMAL HIGH
Non-HDL Cholesterol (Calc): 151 mg/dL (calc) — ABNORMAL HIGH (ref <130)
Total CHOL/HDL Ratio: 4 (calc) (ref <5.0)
Triglycerides: 131 mg/dL (ref <150)

## 2018-01-26 LAB — HEMOGLOBIN A1C
Hgb A1c MFr Bld: 10.1 % of total Hgb — ABNORMAL HIGH (ref <5.7)
Mean Plasma Glucose: 243 (calc)
eAG (mmol/L): 13.5 (calc)

## 2018-01-26 LAB — TSH: TSH: 1.79 mIU/L (ref 0.40–4.50)

## 2018-02-09 ENCOUNTER — Encounter: Payer: Self-pay | Admitting: *Deleted

## 2018-02-19 ENCOUNTER — Other Ambulatory Visit: Payer: Self-pay | Admitting: Physician Assistant

## 2018-02-19 DIAGNOSIS — I10 Essential (primary) hypertension: Secondary | ICD-10-CM

## 2018-02-25 NOTE — Progress Notes (Signed)
Assessment and Plan:  Essential hypertension - continue medications, DASH diet, exercise and monitor at home. Call if greater than 130/80.   Diabetes mellitus without complication (HCC) Will continue a GLP, has been on trulicity but likely with his insurnace Bcise will be less expenisve, will give samples of this and he can get a coupon online.  He will get the farxiga today but if it is expensive he will contact the office and we will try jardiance.   Hyperlipidemia, unspecified hyperlipidemia type - follow up 3 months for progress monitoring - increase veggies, decrease carbs - long discussion about weight loss, diet, and exercise  Morbid obesity (HCC) - follow up 1 month for progress monitoring - increase veggies, decrease carbs - long discussion about weight loss, diet, and exercise - goal is 220 in 3 months  - get AB [place] at somepoint - add more veggies, and plan one meal a week  - continue to document meals  Future Appointments  Date Time Provider Department Center  03/26/2018 11:30 AM Quentin Mullingollier, Baudelio Karnes, PA-C [facility] None  04/30/2018 11:30 AM Quentin Mullingollier, Miguel Medal, PA-C [facility] None  06/08/2018  2:00 PM Quentin Mullingollier, Jaleeah Slight, PA-C [facility] None     HPI 32 y.o.male presents for follow up for HTN, chol, DM and obesity His blood pressure has been controlled at home, today their BP is BP: 136/80   He is on cholesterol medication, started back on lipitor and denies myalgias.  Lab Results  Component Value Date   CHOL 201 (H) 01/25/2018   HDL 50 01/25/2018   LDLCALC 126 (H) 01/25/2018   TRIG 131 01/25/2018   CHOLHDL 4.0 01/25/2018   BMI is Body mass index is 35.38 kg/m., he is working on diet and exercise. Wants a goal for 3 months will be 220. But we will see monthly until that time.  Wt Readings from Last 3 Encounters:  02/26/18 239 lb 9.6 oz (108.7 kg)  01/25/18 248 lb 9.6 oz (112.8 kg)  06/03/17 239 lb 3.2 oz (108.5 kg)   He is trying to add more water, trying to stop  drinks.  Starting to incorporate weight lifting.  He is trying to do slower changes so that he does not binge eat.  He is not checking his sugars, has a meter at home.  He was started on metformin, trulicity now on the 1.8 dose and tolerating well.  He has a RX for the farxiga but has not started it.  Lab Results  Component Value Date   HGBA1C 10.1 (H) 01/25/2018   Blood pressure 136/80, pulse 78, temperature (!) 97.3 F (36.3 C), weight 239 lb 9.6 oz (108.7 kg), SpO2 98 %.    Past Medical History:  Diagnosis Date  . Allergy   . Hyperlipidemia   . Hypertension   . Obesity (BMI 30-39.9)   . Type II or unspecified type diabetes mellitus without mention of complication, not stated as uncontrolled   . Vitamin D deficiency      No Known Allergies  Current Outpatient Medications on File Prior to Visit  Medication Sig  . atorvastatin (LIPITOR) 80 MG tablet TAKE 1 TABLET BY MOUTH EVERY DAY  . Cholecalciferol (VITAMIN D PO) Take 5,000 Int'l Units by mouth daily.  . dapagliflozin propanediol (FARXIGA) 10 MG TABS tablet Take 10 mg by mouth daily.  Marland Kitchen. losartan (COZAAR) 100 MG tablet TAKE 1 TABLET BY MOUTH EVERY DAY  . metFORMIN (GLUCOPHAGE-XR) 500 MG 24 hr tablet TAKE 1 TO 2 TABLETS 2 X DAY  FOR DIABETES   No current facility-administered medications on file prior to visit.     ROS: all negative except above.   Physical Exam: Filed Weights   02/26/18 1142  Weight: 239 lb 9.6 oz (108.7 kg)   BP 136/80   Pulse 78   Temp (!) 97.3 F (36.3 C)   Wt 239 lb 9.6 oz (108.7 kg)   SpO2 98%   BMI 35.38 kg/m   General Appearance: Well nourished, in no apparent distress. Eyes: PERRLA, EOMs, conjunctiva no swelling or erythema Sinuses: No Frontal/maxillary tenderness ENT/Mouth: Ext aud canals clear, TMs without erythema, bulging. No erythema, swelling, or exudate on post pharynx.  Tonsils not swollen or erythematous. Hearing normal.  Neck: Supple, thyroid normal.  Respiratory:  Respiratory effort normal, BS equal bilaterally without rales, rhonchi, wheezing or stridor.  Cardio: RRR with no MRGs. Brisk peripheral pulses without edema.  Abdomen: Soft, + BS.  Non tender, no guarding, rebound, hernias, masses. Lymphatics: Non tender without lymphadenopathy.  Musculoskeletal: Full ROM, 5/5 strength, normal gait.  Skin: Warm, dry without rashes, lesions, ecchymosis.  Neuro: Cranial nerves intact. Normal muscle tone, no cerebellar symptoms. Sensation intact.  Psych: Awake and oriented X 3, normal affect, Insight and Judgment appropriate.     [name], PA-C 12:36 PM [facility]

## 2018-02-26 ENCOUNTER — Ambulatory Visit (INDEPENDENT_AMBULATORY_CARE_PROVIDER_SITE_OTHER): Payer: BLUE CROSS/BLUE SHIELD | Admitting: Physician Assistant

## 2018-02-26 VITALS — BP 136/80 | HR 78 | Temp 97.3°F | Wt 239.6 lb

## 2018-02-26 DIAGNOSIS — E785 Hyperlipidemia, unspecified: Secondary | ICD-10-CM

## 2018-02-26 DIAGNOSIS — I10 Essential (primary) hypertension: Secondary | ICD-10-CM

## 2018-02-26 DIAGNOSIS — E119 Type 2 diabetes mellitus without complications: Secondary | ICD-10-CM

## 2018-02-26 MED ORDER — EXENATIDE ER 2 MG/0.85ML ~~LOC~~ AUIJ: 2.0000 mg | AUTO-INJECTOR | SUBCUTANEOUS | 5 refills | Status: DC

## 2018-02-26 NOTE — Patient Instructions (Addendum)
Diet soda leads to weight gain.  We recently discovered that the artifical sugar in the soda stops an enzyme in your stomach that is suppose to signal that your brain is full. So patients that drink a lot of diet soda will never feel full and tend to over eat. So please cut back on diet soda and it can help with your weight loss.   A study found that two or more sodas a day increased the risk of hypertension, diabetes, stroke.   Another recent study found that higher consumption of soda and juice was associated with increased risk of cancer and breast cancer.   In animal studies artifical sweeteners affect the gut bacteria and may contribute to chronic disease this way.   Continue your food diary  Goal weight is 220  Intermittent fasting is more about strategy than starvation. It's meant to reset your body in different ways, hopefully with fitness and nutrition changes as a result.  Like any big switchover, though, results may vary when it comes down to the individual level. What works for your friends may not work for you, or vice versa. That's why it's helpful to play around with variations on intermittent fasting and healthy habits and find what works best for you.  WHAT IS INTERMITTENT FASTING AND WHY DO IT?  Intermittent fasting doesn't involve specific foods, but rather, a strict schedule regarding when you eat. Also called "time-restricted eating," the tactic has been praised for its contribution to weight loss, improved body composition, and decreased cravings. Preliminary research also suggests it may be beneficial for glucose tolerance, hormone regulation, better muscle mass and lower body fat.  Part of its appeal is the simplicity of the effort. Unlike some other trends, there's no calculations to intermittent fasting.  You simply eat within a certain block of time, usually a window of 8-10 hours. In the other big block of time - about 14-16 hours, including when you're asleep - you  don't eat anything, not even snacks. You can drink water, coffee, tea or any other beverage that doesn't have calories.  For example, if you like having a late dinner, you might skip breakfast and have your first meal at noon and your last meal of the day at 8 p.m., and then not eat until noon again the next day.  IDEAS FOR GETTING STARTED  If you're new to the strategy, it may be helpful to eat within the typical circadian rhythm and keep eating within daylight hours. This can be especially beneficial if you're looking at intermittent fasting for weight-loss goals.  So first try only eating between 12pm to 8pm.  Outside of this time you may have water, black coffee, and hot tea. You may not eat it drink anything that has carbs, sugars, OR artificial sugars like diet soda.   Like any major eating and fitness shift, it can take time to find the perfect fit, so don't be afraid to experiment with different options - including ditching intermittent fasting altogether if it's simply not for you. But if it is, you may be surprised by some of the benefits that come along with the strategy.

## 2018-03-12 ENCOUNTER — Other Ambulatory Visit: Payer: Self-pay | Admitting: Adult Health

## 2018-03-26 ENCOUNTER — Ambulatory Visit: Payer: Self-pay | Admitting: Physician Assistant

## 2018-04-09 ENCOUNTER — Ambulatory Visit: Payer: Self-pay | Admitting: Physician Assistant

## 2018-04-29 NOTE — Progress Notes (Signed)
THIS ENCOUNTER IS A VIRTUAL/TELEPHONE VISIT DUE TO COVID-19 - PATIENT WAS NOT SEEN IN THE OFFICE.  PATIENT HAS CONSENTED TO VIRTUAL VISIT / TELEMEDICINE VISIT  This provider placed a call to Charlton Amor using telephone, his appointment was changed to a virtual office visit to reduce the risk of exposure to the COVID-19 virus and to help remain healthy and safe. The virtual visit will also provide continuity of care. He verbalizes understanding.   Assessment and Plan:  Essential hypertension - continue medications, DASH diet, exercise and monitor at home. Call if greater than 130/80.   Diabetes mellitus without complication (HCC) Will try Rybelsus with coupon, 3 mg once a day Continue farxiga and metformin CHECK SUGARS, free style lite strips sent in Will contact 1 month  Hyperlipidemia, unspecified hyperlipidemia type - follow up 3 months for progress monitoring - increase veggies, decrease carbs - long discussion about weight loss, diet, and exercise  Morbid obesity (HCC) - follow up 1 month for progress monitoring - increase veggies, decrease carbs - long discussion about weight loss, diet, and exercise - goal is 220 in 3 months  - get AB [place] at somepoint - add more veggies, and plan one meal a week  - start back to documenting meals  Future Appointments  Date Time Provider Department Center  06/25/2018 11:00 AM [name], PA-C [facility] None   Follow up 1 month HPI 32 y.o.male presents for follow up for HTN, chol, DM and obesity He is out of test strips, needs freestyle lite test strips. Will send that to the pharmacy.   He is on cholesterol medication, started back on lipitor and denies myalgias.  Lab Results  Component Value Date   CHOL 201 (H) 01/25/2018   HDL 50 01/25/2018   LDLCALC 126 (H) 01/25/2018   TRIG 131 01/25/2018   CHOLHDL 4.0 01/25/2018   BMI is Body mass index is 34.56 kg/m., he is working on diet and exercise, he is eating out less, he is  walking more to get out of the house. Wants a goal for 3 months will be 220, he is at 234.  Wt Readings from Last 3 Encounters:  04/30/18 234 lb (106.1 kg)  02/26/18 239 lb 9.6 oz (108.7 kg)  01/25/18 248 lb 9.6 oz (112.8 kg)   He is trying to add more water, he is doing packet in the water, and will diet soda rarely.  He is trying to do slower changes so that he does not binge eat.  He is not checking his sugars, has a meter at home.  He was started on metformin and farxiga, could not afford the trulicity or the Bcise.  Lab Results  Component Value Date   HGBA1C 10.1 (H) 01/25/2018   Height 5\' 9"  (1.753 m), weight 234 lb (106.1 kg).    Past Medical History:  Diagnosis Date  . Allergy   . Hyperlipidemia   . Hypertension   . Obesity (BMI 30-39.9)   . Type II or unspecified type diabetes mellitus without mention of complication, not stated as uncontrolled   . Vitamin D deficiency      No Known Allergies  Current Outpatient Medications on File Prior to Visit  Medication Sig  . atorvastatin (LIPITOR) 80 MG tablet TAKE 1 TABLET BY MOUTH EVERY DAY  . Cholecalciferol (VITAMIN D PO) Take 5,000 Int'l Units by mouth daily.  . dapagliflozin propanediol (FARXIGA) 10 MG TABS tablet Take 10 mg by mouth daily.  . Exenatide ER (BYDUREON  BCISE) 2 MG/0.85ML AUIJ Inject 2 mg into the skin once a week.  . losartan (COZAAR) 100 MG tablet TAKE 1 TABLET BY MOUTH EVERY DAY  . metFORMIN (GLUCOPHAGE-XR) 500 MG 24 hr tablet TAKE 1 TO 2 TABLETS 2 X DAY FOR DIABETES   No current facility-administered medications on file prior to visit.     ROS: all negative except above.   Physical Exam: Filed Weights   04/30/18 1036  Weight: 234 lb (106.1 kg)   Ht 5\' 9"  (1.753 m)   Wt 234 lb (106.1 kg)   BMI 34.56 kg/m  General Appearance:Well sounding, in no apparent distress.  ENT/Mouth: No hoarseness, No cough for duration of visit.  Respiratory: completing full sentences without distress, without  audible wheeze Neuro: Awake and oriented X 3,  Psych:  Insight and Judgment appropriate.      [name], PA-C 10:42 AM [facility]

## 2018-04-30 ENCOUNTER — Encounter: Payer: Self-pay | Admitting: Physician Assistant

## 2018-04-30 ENCOUNTER — Ambulatory Visit: Payer: BLUE CROSS/BLUE SHIELD | Admitting: Physician Assistant

## 2018-04-30 ENCOUNTER — Ambulatory Visit: Payer: Self-pay | Admitting: Physician Assistant

## 2018-04-30 ENCOUNTER — Other Ambulatory Visit: Payer: Self-pay

## 2018-04-30 VITALS — Ht 69.0 in | Wt 234.0 lb

## 2018-04-30 DIAGNOSIS — E119 Type 2 diabetes mellitus without complications: Secondary | ICD-10-CM

## 2018-04-30 MED ORDER — SEMAGLUTIDE 3 MG PO TABS: 3.0000 mg | ORAL_TABLET | Freq: Every day | ORAL | 0 refills | Status: DC

## 2018-04-30 MED ORDER — GLUCOSE BLOOD VI STRP: ORAL_STRIP | 12 refills | Status: DC

## 2018-04-30 NOTE — Patient Instructions (Addendum)
We will start you on Rybelsus You have to take 3 mg for 1 month Take it with water only in the morning 30 mins before food  It should NOT be run through your insurance, get the pharmacy to run it with the savings card that you can get from one of these areas below  Test READY to [phone] to get a copay savings and text message to help you start RYBELSUS  Or visit [email or web address] to download a Savings Card  Check your sugars AT least once a day  If your morning sugar is always below 100 then the issue is with your sugar spiking after meals. Try to take your blood sugar approximately 2 hours after eating, this number should be less than 200. If it is not, think about the foods that you ate and better choices you can make.   Get back on myfitness pal to log food so I can check on your progress  What does your A1C results mean?  Your A1C is a measure of your sugar over the past 3 months   Use this chart as a guide to compare the results of your A1C blood test to your estimated average daily blood sugar:  A1C Range Average Sugar  4.0-6.0% 60-120 mg/dl  7.7-8.2% 423 - 536 mg/dl  1.4-4.3% 154-008 mg/dl  6.7-6.1% 950-932 mg/dl  67.1-24% 580-998 mg/dl  33.8-25.0% 539-767 mg/dl  34.1-93.7% 902-409 mg/dl  73.5-32.9% 924-268 mg/dl  Greater than 34.1% Greater than 360 mg/dl    Before you even begin to attack a weight-loss plan, it pays to remember this: You are not fat. You have fat. Losing weight isn't about blame or shame; it's simply another achievement to accomplish. Dieting is like any other skill-you have to buckle down and work at it. As long as you act in a smart, reasonable way, you'll ultimately get where you want to be. Here are some weight loss pearls for you.  1. It's Not a Diet. It's a Lifestyle Thinking of a diet as something you're on and suffering through only for the short term doesn't work. To shed weight and keep it off, you need to make permanent changes to the way you  eat. It's OK to indulge occasionally, of course, but if you cut calories temporarily and then revert to your old way of eating, you'll gain back the weight quicker than you can say yo-yo. Use it to lose it. Research shows that one of the best predictors of long-term weight loss is how many pounds you drop in the first month. For that reason, nutritionists often suggest being stricter for the first two weeks of your new eating strategy to build momentum. Cut out added sugar and alcohol and avoid unrefined carbs. After that, figure out how you can reincorporate them in a way that's healthy and maintainable.  2. There's a Right Way to Exercise Working out burns calories and fat and boosts your metabolism by building muscle. But those trying to lose weight are notorious for overestimating the number of calories they burn and underestimating the amount they take in. Unfortunately, your system is biologically programmed to hold on to extra pounds and that means when you start exercising, your body senses the deficit and ramps up its hunger signals. If you're not diligent, you'll eat everything you burn and then some. Use it to lose it. Cardio gets all the exercise glory, but strength and interval training are the real heroes. They help you build lean muscle, which in turn  increases your metabolism and calorie-burning ability 3. Don't Overreact to Mild Hunger Some people have a hard time losing weight because of hunger anxiety. To them, being hungry is bad-something to be avoided at all costs-so they carry snacks with them and eat when they don't need to. Others eat because they're stressed out or bored. While you never want to get to the point of being ravenous (that's when bingeing is likely to happen), a hunger pang, a craving, or the fact that it's 3:00 p.m. should not send you racing for the vending machine or obsessing about the energy bar in your purse. Ideally, you should put off eating until your stomach is  growling and it's difficult to concentrate.  Use it to lose it. When you feel the urge to eat, use the HALT method. Ask yourself, Am I really hungry? Or am I angry or anxious, lonely or bored, or tired? If you're still not certain, try the apple test. If you're truly hungry, an apple should seem delicious; if it doesn't, something else is going on. Or you can try drinking water and making yourself busy, if you are still hungry try a healthy snack.  4. Not All Calories Are Created Equal The mechanics of weight loss are pretty simple: Take in fewer calories than you use for energy. But the kind of food you eat makes all the difference. Processed food that's high in saturated fat and refined starch or sugar can cause inflammation that disrupts the hormone signals that tell your brain you're full. The result: You eat a lot more.  Use it to lose it. Clean up your diet. Swap in whole, unprocessed foods, including vegetables, lean protein, and healthy fats that will fill you up and give you the biggest nutritional bang for your calorie buck. In a few weeks, as your brain starts receiving regular hunger and fullness signals once again, you'll notice that you feel less hungry overall and naturally start cutting back on the amount you eat.  5. Protein, Produce, and Plant-Based Fats Are Your Weight-Loss Trinity Here's why eating the three Ps regularly will help you drop pounds. Protein fills you up. You need it to build lean muscle, which keeps your metabolism humming so that you can torch more fat. People in a weight-loss program who ate double the recommended daily allowance for protein (about 110 grams for a 150-pound woman) lost 70 percent of their weight from fat, while people who ate the RDA lost only about 40 percent, one study found. Produce is packed with filling fiber. "It's very difficult to consume too many calories if you're eating a lot of vegetables. Example: Three cups of broccoli is a lot of food, yet  only 93 calories. (Fruit is another story. It can be easy to overeat and can contain a lot of calories from sugar, so be sure to monitor your intake.) Plant-based fats like olive oil and those in avocados and nuts are healthy and extra satiating.  Use it to lose it. Aim to incorporate each of the three Ps into every meal and snack. People who eat protein throughout the day are able to keep weight off, according to a study in the American Journal of Clinical Nutrition. In addition to meat, poultry and seafood, good sources are beans, lentils, eggs, tofu, and yogurt. As for fat, keep portion sizes in check by measuring out salad dressing, oil, and nut butters (shoot for one to two tablespoons). Finally, eat veggies or a little fruit at every  meal. People who did that consumed 308 fewer calories but didn't feel any hungrier than when they didn't eat more produce.  7. How You Eat Is As Important As What You Eat In order for your brain to register that you're full, you need to focus on what you're eating. Sit down whenever you eat, preferably at a table. Turn off the TV or computer, put down your phone, and look at your food. Smell it. Chew slowly, and don't put another bite on your fork until you swallow. When women ate lunch this attentively, they consumed 30 percent less when snacking later than those who listened to an audiobook at lunchtime, according to a study in the [place] Journal of Nutrition. 8. Weighing Yourself Really Works The scale provides the best evidence about whether your efforts are paying off. Seeing the numbers tick up or down or stagnate is motivation to keep going-or to rethink your approach. A 2015 study at [facility] found that daily weigh-ins helped people lose more weight, keep it off, and maintain that loss, even after two years. Use it to lose it. Step on the scale at the same time every day for the best results. If your weight shoots up several pounds from one weigh-in to  the next, don't freak out. Eating a lot of salt the night before or having your period is the likely culprit. The number should return to normal in a day or two. It's a steady climb that you need to do something about. 9. Too Much Stress and Too Little Sleep Are Your Enemies When you're tired and frazzled, your body cranks up the production of cortisol, the stress hormone that can cause carb cravings. Not getting enough sleep also boosts your levels of ghrelin, a hormone associated with hunger, while suppressing leptin, a hormone that signals fullness and satiety. People on a diet who slept only five and a half hours a night for two weeks lost 55 percent less fat and were hungrier than those who slept eight and a half hours, according to a study in the [place] [employer] Journal. Use it to lose it. Prioritize sleep, aiming for seven hours or more a night, which research shows helps lower stress. And make sure you're getting quality zzz's. If a snoring spouse or a fidgety cat wakes you up frequently throughout the night, you may end up getting the equivalent of just four hours of sleep, according to a study from [facility]. Keep pets out of the bedroom, and use a white-noise app to drown out snoring. 10. You Will Hit a plateau-And You Can Bust Through It As you slim down, your body releases much less leptin, the fullness hormone.  If you're not strength training, start right now. Building muscle can raise your metabolism to help you overcome a plateau. To keep your body challenged and burning calories, incorporate new moves and more intense intervals into your workouts or add another sweat session to your weekly routine. Alternatively, cut an extra 100 calories or so a day from your diet. Now that you've lost weight, your body simply doesn't need as much fuel.

## 2018-05-24 ENCOUNTER — Other Ambulatory Visit: Payer: Self-pay

## 2018-05-24 DIAGNOSIS — I10 Essential (primary) hypertension: Secondary | ICD-10-CM

## 2018-05-24 MED ORDER — LOSARTAN POTASSIUM 100 MG PO TABS: 100.0000 mg | ORAL_TABLET | Freq: Every day | ORAL | 1 refills | Status: DC

## 2018-05-24 MED ORDER — METFORMIN HCL ER 500 MG PO TB24: ORAL_TABLET | ORAL | 1 refills | Status: DC

## 2018-05-31 ENCOUNTER — Other Ambulatory Visit: Payer: Self-pay | Admitting: Physician Assistant

## 2018-06-04 ENCOUNTER — Ambulatory Visit: Payer: BLUE CROSS/BLUE SHIELD | Admitting: Physician Assistant

## 2018-06-07 ENCOUNTER — Other Ambulatory Visit: Payer: Self-pay | Admitting: Physician Assistant

## 2018-06-08 ENCOUNTER — Encounter: Payer: Self-pay | Admitting: Physician Assistant

## 2018-06-23 NOTE — Progress Notes (Deleted)
THIS ENCOUNTER IS A VIRTUAL/TELEPHONE VISIT DUE TO COVID-19 - PATIENT WAS NOT SEEN IN THE OFFICE.  PATIENT HAS CONSENTED TO VIRTUAL VISIT / TELEMEDICINE VISIT  This provider placed a call to Louis Fitzpatrick using telephone, his appointment was changed to a virtual office visit to reduce the risk of exposure to the COVID-19 virus and to help remain healthy and safe. The virtual visit will also provide continuity of care. He verbalizes understanding.   Assessment and Plan:  Essential hypertension - continue medications, DASH diet, exercise and monitor at home. Call if greater than 130/80.   Diabetes mellitus without complication (HCC) Will try Rybelsus with coupon, 3 mg once a day Continue farxiga and metformin CHECK SUGARS, free style lite strips sent in Will contact 1 month  Hyperlipidemia, unspecified hyperlipidemia type - follow up 3 months for progress monitoring - increase veggies, decrease carbs - long discussion about weight loss, diet, and exercise  Morbid obesity (HCC) - follow up 1 month for progress monitoring - increase veggies, decrease carbs - long discussion about weight loss, diet, and exercise - goal is 220 in 3 months  - get AB [place] at somepoint - add more veggies, and plan one meal a week  - start back to documenting meals  Future Appointments  Date Time Provider Department Center  06/25/2018 10:00 AM Quentin Mullingollier, Abbott Jasinski, PA-C [facility] None   Follow up 1 month HPI 32 y.o.male presents for follow up for HTN, chol, DM and obesity He is out of test strips, needs freestyle lite test strips. Will send that to the pharmacy.   He is on cholesterol medication, started back on lipitor and denies myalgias.  Lab Results  Component Value Date   CHOL 201 (H) 01/25/2018   HDL 50 01/25/2018   LDLCALC 126 (H) 01/25/2018   TRIG 131 01/25/2018   CHOLHDL 4.0 01/25/2018   BMI is There is no height or weight on file to calculate BMI., he is working on diet and exercise, he is  eating out less, he is walking more to get out of the house. Wants a goal for 3 months will be 220, he is at 234.  Wt Readings from Last 3 Encounters:  04/30/18 234 lb (106.1 kg)  02/26/18 239 lb 9.6 oz (108.7 kg)  01/25/18 248 lb 9.6 oz (112.8 kg)   He is trying to add more water, he is doing packet in the water, and will diet soda rarely.  He is trying to do slower changes so that he does not binge eat.  He is not checking his sugars, has a meter at home.  He was started on metformin and farxiga, could not afford the trulicity or the Bcise.  Lab Results  Component Value Date   HGBA1C 10.1 (H) 01/25/2018   There were no vitals taken for this visit.    Past Medical History:  Diagnosis Date  . Allergy   . Hyperlipidemia   . Hypertension   . Obesity (BMI 30-39.9)   . Type II or unspecified type diabetes mellitus without mention of complication, not stated as uncontrolled   . Vitamin D deficiency      No Known Allergies  Current Outpatient Medications on File Prior to Visit  Medication Sig  . atorvastatin (LIPITOR) 80 MG tablet TAKE 1 TABLET BY MOUTH EVERY DAY  . Cholecalciferol (VITAMIN D PO) Take 5,000 Int'l Units by mouth daily.  . Exenatide ER (BYDUREON BCISE) 2 MG/0.85ML AUIJ Inject 2 mg into the skin once a  week.  Marland Kitchen FARXIGA 10 MG TABS tablet TAKE 1 TABLET BY MOUTH DAILY  . glucose blood (FREESTYLE LITE) test strip Test sugar once daily DX E11.9  . losartan (COZAAR) 100 MG tablet Take 1 tablet (100 mg total) by mouth daily.  . metFORMIN (GLUCOPHAGE-XR) 500 MG 24 hr tablet TAKE 1 TO 2 TABLETS 2 X DAY FOR DIABETES  . RYBELSUS 3 MG TABS TAKE 1 TABLET BY MOUTH DAILY BEFORE BREAKFAST   No current facility-administered medications on file prior to visit.     ROS: all negative except above.   Physical Exam: There were no vitals filed for this visit. There were no vitals taken for this visit. General Appearance:Well sounding, in no apparent distress.  ENT/Mouth: No  hoarseness, No cough for duration of visit.  Respiratory: completing full sentences without distress, without audible wheeze Neuro: Awake and oriented X 3,  Psych:  Insight and Judgment appropriate.      [name], PA-C 8:43 AM [facility]

## 2018-06-25 ENCOUNTER — Encounter: Payer: Self-pay | Admitting: Physician Assistant

## 2018-06-25 ENCOUNTER — Ambulatory Visit: Payer: BLUE CROSS/BLUE SHIELD | Admitting: Physician Assistant

## 2018-07-15 ENCOUNTER — Other Ambulatory Visit: Payer: Self-pay | Admitting: Physician Assistant

## 2018-07-22 ENCOUNTER — Telehealth: Payer: Self-pay

## 2018-07-22 NOTE — Telephone Encounter (Signed)
Patient is unable to keep his TELE-Visit due to him moving. Patient states he will reschedule to a later date. Patient was then transferred to front office.

## 2018-07-23 ENCOUNTER — Ambulatory Visit: Payer: BLUE CROSS/BLUE SHIELD | Admitting: Physician Assistant

## 2018-08-22 ENCOUNTER — Other Ambulatory Visit: Payer: Self-pay | Admitting: Physician Assistant

## 2018-09-27 ENCOUNTER — Other Ambulatory Visit: Payer: Self-pay | Admitting: Physician Assistant

## 2018-09-28 ENCOUNTER — Other Ambulatory Visit: Payer: Self-pay | Admitting: Physician Assistant

## 2018-09-28 MED ORDER — RYBELSUS 7 MG PO TABS: 7.0000 mg | ORAL_TABLET | ORAL | 0 refills | Status: DC

## 2018-09-28 NOTE — Progress Notes (Signed)
Future Appointments  Date Time Provider [place]  11/26/2018 10:00 AM Vicie Mutters, PA-C [facility] None   Has canceled several appointment, needs to keep every 3 months. Very important with DM and how young he is.

## 2018-10-07 DIAGNOSIS — L600 Ingrowing nail: Secondary | ICD-10-CM | POA: Diagnosis not present

## 2018-10-07 DIAGNOSIS — L03031 Cellulitis of right toe: Secondary | ICD-10-CM | POA: Diagnosis not present

## 2018-10-10 DIAGNOSIS — L03031 Cellulitis of right toe: Secondary | ICD-10-CM | POA: Diagnosis not present

## 2018-10-10 DIAGNOSIS — E119 Type 2 diabetes mellitus without complications: Secondary | ICD-10-CM | POA: Diagnosis not present

## 2018-10-10 DIAGNOSIS — L600 Ingrowing nail: Secondary | ICD-10-CM | POA: Diagnosis not present

## 2018-10-15 ENCOUNTER — Ambulatory Visit: Payer: BC Managed Care – PPO | Admitting: Physician Assistant

## 2018-11-22 ENCOUNTER — Other Ambulatory Visit: Payer: Self-pay | Admitting: Physician Assistant

## 2018-11-22 ENCOUNTER — Other Ambulatory Visit: Payer: Self-pay | Admitting: Adult Health

## 2018-11-24 NOTE — Progress Notes (Deleted)
Assessment and Plan:  Essential hypertension - continue medications, DASH diet, exercise and monitor at home. Call if greater than 130/80.   Diabetes mellitus without complication ([place]) Will try Rybelsus with coupon, 3 mg once a day Continue farxiga and metformin  Hyperlipidemia, unspecified hyperlipidemia type - follow up 3 months for progress monitoring - increase veggies, decrease carbs - long discussion about weight loss, diet, and exercise  Morbid obesity ([place]) - follow up 1 month for progress monitoring - increase veggies, decrease carbs - long discussion about weight loss, diet, and exercise - goal is 220 in 3 months  - get AB [place] at somepoint - add more veggies, and plan one meal a week  - start back to documenting meals  Future Appointments  Date Time Provider Mulat  11/26/2018 10:00 AM Louis Mutters, PA-C [facility] None    HPI 32 y.o.male presents for follow up for HTN, chol, DM and obesity He is out of test strips, needs freestyle lite test strips. Will send that to the pharmacy.   He is on cholesterol medication, started back on lipitor and denies myalgias.  Lab Results  Component Value Date   CHOL 201 (H) 01/25/2018   HDL 50 01/25/2018   LDLCALC 126 (H) 01/25/2018   TRIG 131 01/25/2018   CHOLHDL 4.0 01/25/2018   BMI is There is no height or weight on file to calculate BMI., he is working on diet and exercise, he is eating out less, he is walking more to get out of the house. Wants a goal for 3 months will be 220, he is at 234.  Wt Readings from Last 3 Encounters:  04/30/18 234 lb (106.1 kg)  02/26/18 239 lb 9.6 oz (108.7 kg)  01/25/18 248 lb 9.6 oz (112.8 kg)   He is trying to add more water, he is doing packet in the water, and will diet soda rarely.  He is trying to do slower changes so that he does not binge eat.  He is not checking his sugars, has a meter at home.  He was started on metformin and farxiga, could not afford the trulicity  or the Bcise.  Lab Results  Component Value Date   HGBA1C 10.1 (H) 01/25/2018   There were no vitals taken for this visit.    Past Medical History:  Diagnosis Date  . Allergy   . Hyperlipidemia   . Hypertension   . Obesity (BMI 30-39.9)   . Type II or unspecified type diabetes mellitus without mention of complication, not stated as uncontrolled   . Vitamin D deficiency      No Known Allergies  Current Outpatient Medications on File Prior to Visit  Medication Sig  . atorvastatin (LIPITOR) 80 MG tablet TAKE 1 TABLET BY MOUTH EVERY DAY  . Cholecalciferol (VITAMIN D PO) Take 5,000 Int'l Units by mouth daily.  . dapagliflozin propanediol (FARXIGA) 10 MG TABS tablet Take 1 tablet Daily for Diabetes - to last til 10/30 appointment since missed or cancelled last 5 Office Visits  . glucose blood (FREESTYLE LITE) test strip Test sugar once daily DX E11.9  . losartan (COZAAR) 100 MG tablet Take 1 tablet (100 mg total) by mouth daily.  . metFORMIN (GLUCOPHAGE-XR) 500 MG 24 hr tablet TAKE 1 TO 2 TABLETS BY MOUTH TWICE DAILY FOR DIABETES  . Semaglutide (RYBELSUS) 7 MG TABS Take 1 tablet by mouth every morning. Take 1 tablet Daily for Diabetes to last til 10/30 app't since missed  / cancelled last 5  app'ts   No current facility-administered medications on file prior to visit.     ROS: all negative except above.   Physical Exam: There were no vitals filed for this visit. There were no vitals taken for this visit. General Appearance: Well nourished, in no apparent distress. Eyes: PERRLA, EOMs, conjunctiva no swelling or erythema Sinuses: No Frontal/maxillary tenderness ENT/Mouth: Ext aud canals clear, TMs without erythema, bulging. No erythema, swelling, or exudate on post pharynx.  Tonsils not swollen or erythematous. Hearing normal.  Neck: Supple, thyroid normal.  Respiratory: Respiratory effort normal, BS equal bilaterally without rales, rhonchi, wheezing or stridor.  Cardio: RRR with  no MRGs. Brisk peripheral pulses without edema.  Abdomen: Soft, + BS.  Non tender, no guarding, rebound, hernias, masses. Lymphatics: Non tender without lymphadenopathy.  Musculoskeletal: Full ROM, 5/5 strength, normal gait.  Skin: Warm, dry without rashes, lesions, ecchymosis.  Neuro: Cranial nerves intact. No cerebellar symptoms. Sensation intact.  Psych: Awake and oriented X 3, normal affect, Insight and Judgment appropriate.     Louis Mulling, PA-C 1:53 PM [facility]

## 2018-11-26 ENCOUNTER — Ambulatory Visit: Payer: BC Managed Care – PPO | Admitting: Physician Assistant

## 2019-03-02 ENCOUNTER — Other Ambulatory Visit: Payer: Self-pay | Admitting: Physician Assistant

## 2019-03-02 DIAGNOSIS — I10 Essential (primary) hypertension: Secondary | ICD-10-CM

## 2019-04-01 ENCOUNTER — Ambulatory Visit: Payer: Self-pay | Attending: Internal Medicine

## 2019-04-01 DIAGNOSIS — Z23 Encounter for immunization: Secondary | ICD-10-CM | POA: Insufficient documentation

## 2019-04-01 NOTE — Progress Notes (Signed)
   Covid-19 Vaccination Clinic  Name:  JANIEL CRISOSTOMO    MRN: [id number] DOB: 06/12/1985  04/01/2019  Mr. Borthwick was observed post Covid-19 immunization for 15 minutes without incident. He was provided with Vaccine Information Sheet and instruction to access the V-Safe system.   Mr. Hengel was instructed to call 911 with any severe reactions post vaccine: Marland Kitchen Difficulty breathing  . Swelling of face and throat  . A fast heartbeat  . A bad rash all over body  . Dizziness and weakness   Immunizations Administered    Name Date Dose VIS Date Route   Pfizer COVID-19 Vaccine 04/01/2019  4:32 PM 0.3 mL 01/07/2019 Intramuscular   Manufacturer: [employer], [employer]   Lot: [id number]   NDC: [address]-2

## 2019-05-03 ENCOUNTER — Ambulatory Visit: Payer: Self-pay | Attending: Internal Medicine

## 2019-05-03 DIAGNOSIS — Z23 Encounter for immunization: Secondary | ICD-10-CM

## 2019-05-03 NOTE — Progress Notes (Signed)
   Covid-19 Vaccination Clinic  Name:  Louis Fitzpatrick    MRN: [id number] DOB: 1985/04/27  05/03/2019  Louis Fitzpatrick was observed post Covid-19 immunization for 15 minutes without incident. He was provided with Vaccine Information Sheet and instruction to access the V-Safe system.   Louis Fitzpatrick was instructed to call 911 with any severe reactions post vaccine: Marland Kitchen Difficulty breathing  . Swelling of face and throat  . A fast heartbeat  . A bad rash all over body  . Dizziness and weakness   Immunizations Administered    Name Date Dose VIS Date Route   Pfizer COVID-19 Vaccine 05/03/2019  9:01 AM 0.3 mL 01/07/2019 Intramuscular   Manufacturer: [employer], [employer]   Lot: [id number]   NDC: [address]-5

## 2019-05-26 ENCOUNTER — Encounter: Payer: BC Managed Care – PPO | Admitting: Physician Assistant

## 2019-06-08 ENCOUNTER — Other Ambulatory Visit: Payer: Self-pay | Admitting: Physician Assistant

## 2019-07-29 ENCOUNTER — Encounter: Payer: BC Managed Care – PPO | Admitting: Physician Assistant

## 2019-09-02 ENCOUNTER — Encounter: Payer: BC Managed Care – PPO | Admitting: Physician Assistant

## 2019-09-14 ENCOUNTER — Other Ambulatory Visit: Payer: Self-pay | Admitting: Internal Medicine

## 2019-09-14 DIAGNOSIS — I10 Essential (primary) hypertension: Secondary | ICD-10-CM

## 2019-12-02 ENCOUNTER — Encounter: Payer: BC Managed Care – PPO | Admitting: Physician Assistant

## 2020-01-12 NOTE — Progress Notes (Deleted)
Complete Physical   Assessment and Plan:  Routine general medical examination at a health care facility  Essential hypertension - continue medications, may be able to cut losartan in half with weight loss, DASH diet, exercise and monitor at home.  - CBC with Differential/Platelet - BASIC METABOLIC PANEL WITH GFR - Hepatic function panel - TSH - Urinalysis, Routine w reflex microscopic (not at [facility]) - Microalbumin / creatinine urine ratio  Morbid Obesity  long discussion about weight loss, diet, and exercise  Uncontrolled T2DM (HCC) Discussed general issues about diabetes pathophysiology and management., Educational material distributed., Suggested low cholesterol diet., Encouraged aerobic exercise., Discussed foot care., Reminded to get yearly retinal exam. - Hemoglobin A1c  Hyperlipidemia associated with T2DM (HCC) -Titrate statin for LDL goal <70 *** -check lipids, decrease fatty foods, increase activity.  - Lipid panel  Vitamin D deficiency - Vit D  25 hydroxy (rtn osteoporosis monitoring)   Allergy, initial encounter Continue OTC allergy pills  Elevated liver function tests Monitor, LFTs have improved, had normal iron panel, hepatitis Never had imaging ***    Medication management - Magnesium   Discussed med's effects and SE's. Screening labs and tests as requested with regular follow-up as recommended.  Future Appointments  Date Time Provider Department Center  01/13/2020 10:30 AM Judd Gaudier, NP [facility] None  01/15/2021 10:00 AM Judd Gaudier, NP [facility] None     HPI 34 y.o. male  presents for a complete physical. He has Hyperlipidemia associated with type 2 diabetes mellitus (HCC); Hypertension; Uncontrolled diabetes mellitus (HCC); Allergy; Vitamin D deficiency; Morbid obesity (HCC); and Elevated liver function tests on their problem list.    He was lost to follow up over the pandemic ***  BMI is There is no height or weight on file to  calculate BMI., he {HAS HAS BOF:75102} been working on diet and exercise. Goal weight at this time is 200lbs, waist measurement is 44 inches, goal is below 40.  Wt Readings from Last 3 Encounters:  04/30/18 234 lb (106.1 kg)  02/26/18 239 lb 9.6 oz (108.7 kg)  01/25/18 248 lb 9.6 oz (112.8 kg)   His blood pressure has been controlled at home, he is on losartan 100mg  did not take today, today their BP is  .  He does workout. He denies chest pain, shortness of breath, dizziness.   He is on cholesterol medication and denies myalgias. His cholesterol is at goal, lipitor 80, 1/2 pill every other day The cholesterol last visit was:   Lab Results  Component Value Date   CHOL 201 (H) 01/25/2018   HDL 50 01/25/2018   LDLCALC 126 (H) 01/25/2018   TRIG 131 01/25/2018   CHOLHDL 4.0 01/25/2018   Lab Results  Component Value Date   ALT 77 (H) 01/25/2018   AST 35 01/25/2018   ALKPHOS 65 12/31/2015   BILITOT 0.5 01/25/2018   He has been working on diet and exercise for T2DM, uncontrolled (currently on metformin, farxiga, rebelsus 7 mg), ASA, statin, ARB and denies paresthesia of the feet, polydipsia and visual disturbances. Last A1C in the office was:  Lab Results  Component Value Date   HGBA1C 10.1 (H) 01/25/2018   Lab Results  Component Value Date   GFRAA 116 01/25/2018   Patient is not on Vitamin D supplement.  Lab Results  Component Value Date   VD25OH 15 (L) 12/31/2015    Has had persistently elevated LFTs, had normal hepatitis panel in 2018, normal iron, never imaging *** Lab Results  Component Value Date   ALT 77 (H) 01/25/2018   AST 35 01/25/2018   ALKPHOS 65 12/31/2015   BILITOT 0.5 01/25/2018   Lab Results  Component Value Date   IRON 128 12/31/2015   TIBC 346 12/31/2015   FERRITIN 327 12/11/2016     Current Medications:  Current Outpatient Medications on File Prior to Visit  Medication Sig Dispense Refill  . atorvastatin (LIPITOR) 80 MG tablet TAKE 1 TABLET BY  MOUTH EVERY DAY 90 tablet 1  . Cholecalciferol (VITAMIN D PO) Take 5,000 Int'l Units by mouth daily.    . dapagliflozin propanediol (FARXIGA) 10 MG TABS tablet Take 1 tablet Daily for Diabetes - to last til 10/30 appointment since missed or cancelled last 5 Office Visits 30 tablet 3  . glucose blood (FREESTYLE LITE) test strip Test sugar once daily DX E11.9 50 each 12  . losartan (COZAAR) 100 MG tablet Take 1 tablet Daily for BP - Need Office Visit before further Refills 21 tablet 0  . metFORMIN (GLUCOPHAGE-XR) 500 MG 24 hr tablet TAKE 1 TO 2 TABLETS BY MOUTH TWICE DAILY FOR DIABETES 360 tablet 0  . Semaglutide (RYBELSUS) 7 MG TABS Take 1 tablet by mouth every morning. Take 1 tablet Daily for Diabetes to last til 10/30 app't since missed  / cancelled last 5 app'ts 5 tablet 0   No current facility-administered medications on file prior to visit.   Health Maintenance:  Immunization History  Administered Date(s) Administered  . HPV 9-valent 06/03/2017, 08/14/2017, 01/25/2018  . PFIZER SARS-COV-2 Vaccination 04/01/2019, 05/03/2019  . Tdap 01/28/2003, 05/26/2013   TDAP: 2015 Pneumovax: *** Flu vaccine: 2017 HPV: 3/3, 2019 Covid 19: 2/2, 2021, pfizer ***  Colonoscopy: N/A EGD: N/A  Eye doctor: 12/2014, forget doctor  Dentist: No dentist  Medical History:  Past Medical History:  Diagnosis Date  . Allergy   . Hyperlipidemia   . Hypertension   . Obesity (BMI 30-39.9)   . Type II or unspecified type diabetes mellitus without mention of complication, not stated as uncontrolled   . Vitamin D deficiency    Allergies No Known Allergies  SURGICAL HISTORY He  has a past surgical history that includes Tonsillectomy (2007). FAMILY HISTORY His family history includes Diabetes in his maternal grandfather. SOCIAL HISTORY He  reports that he has never smoked. He has never used smokeless tobacco. He reports that he does not drink alcohol and does not use drugs. Working in [employer] with [employer] now,  having first baby in April 2018, boy, Joesph Fillers, some increased stress with moving, commutes to [place].   Review of Systems  Constitutional: Negative.  Negative for malaise/fatigue and weight loss.  HENT: Negative.  Negative for hearing loss and tinnitus.   Eyes: Negative.  Negative for blurred vision and double vision.  Respiratory: Negative.  Negative for cough, shortness of breath and wheezing.   Cardiovascular: Negative.  Negative for chest pain, palpitations, orthopnea, claudication and leg swelling.  Gastrointestinal: Negative.  Negative for abdominal pain, blood in stool, constipation, diarrhea, heartburn, melena, nausea and vomiting.  Genitourinary: Negative.   Musculoskeletal: Negative.  Negative for joint pain and myalgias.  Skin: Negative.  Negative for rash.  Neurological: Negative.  Negative for dizziness, tingling, sensory change, weakness and headaches.  Endo/Heme/Allergies: Negative.  Negative for polydipsia.  Psychiatric/Behavioral: Negative.   All other systems reviewed and are negative.  Physical Exam: Estimated body mass index is 34.56 kg/m as calculated from the following:   Height as of 04/30/18: 5\' 9"  (1.753  m).   Weight as of 04/30/18: 234 lb (106.1 kg). There were no vitals taken for this visit. General Appearance: Well nourished, in no apparent distress. Eyes: PERRLA, EOMs, conjunctiva no swelling or erythema, normal fundi and vessels. *** Sinuses: No Frontal/maxillary tenderness ENT/Mouth: Ext aud canals clear, normal light reflex with TMs without erythema, bulging. Good dentition. No erythema, swelling, or exudate on post pharynx. Tonsils not swollen or erythematous. Hearing normal.  Neck: Supple, thyroid normal. No bruits Respiratory: Respiratory effort normal, BS equal bilaterally without rales, rhonchi, wheezing or stridor. Cardio: RRR without murmurs, rubs or gallops. Brisk peripheral pulses without edema.  Chest: symmetric, with normal excursions and  percussion. Abdomen: Soft, +BS. Non tender, no guarding, rebound, hernias, masses, or organomegaly. .  Lymphatics: Non tender without lymphadenopathy.  Genitourinary: defer Musculoskeletal: Full ROM all peripheral extremities,5/5 strength, and normal gait. Skin: Warm, dry without rashes, lesions, ecchymosis.  Neuro: Cranial nerves intact, reflexes equal bilaterally. Normal muscle tone, no cerebellar symptoms. Sensation intact.  Psych: Awake and oriented X 3, normal affect, Insight and Judgment appropriate.   EKG: ***  Dan Maker 2:11 PM

## 2020-01-13 ENCOUNTER — Other Ambulatory Visit: Payer: Self-pay | Admitting: Internal Medicine

## 2020-01-13 ENCOUNTER — Encounter: Payer: BC Managed Care – PPO | Admitting: Adult Health

## 2020-02-03 ENCOUNTER — Ambulatory Visit: Payer: BC Managed Care – PPO | Admitting: Adult Health

## 2020-02-05 ENCOUNTER — Other Ambulatory Visit: Payer: Self-pay | Admitting: Internal Medicine

## 2020-02-07 ENCOUNTER — Other Ambulatory Visit: Payer: Self-pay | Admitting: *Deleted

## 2020-02-07 DIAGNOSIS — I10 Essential (primary) hypertension: Secondary | ICD-10-CM

## 2020-02-07 MED ORDER — LOSARTAN POTASSIUM 100 MG PO TABS: ORAL_TABLET | ORAL | 0 refills | Status: DC

## 2020-02-23 NOTE — Progress Notes (Deleted)
3 MONTH FOLLOW UP  Assessment and Plan:  Essential hypertension - continue medications, DASH diet, exercise and monitor at home. Call if greater than 130/80.   Uncontrolled T2DM (HCC) Will continue a GLP, has been on trulicity but likely with his insurnace Bcise will be less expenisve, will give samples of this and he can get a coupon online.  He will get the farxiga today but if it is expensive he will contact the office and we will try jardiance.   Hyperlipidemia associated with T2DM (HCC) - titrate for LDL goal <70 - follow up 3 months for progress monitoring - increase veggies, decrease carbs - long discussion about weight loss, diet, and exercise  Morbid obesity (HCC) - follow up 1 month for progress monitoring - increase veggies, decrease carbs - long discussion about weight loss, diet, and exercise - goal is 220 in 3 months  - get AB [place] at some point *** - add more veggies, and plan one meal a week  - continue to document meals  Future Appointments  Date Time Provider Department Center  02/27/2020 10:30 AM Judd Gaudier, NP [facility] None  04/26/2020  3:00 PM Elder Negus, NP [facility] None     HPI 35 y.o.male presents for follow up for HTN, chol, DM and obesity.   He was lost to follow up over the pandemic, last labs were 01/25/2018.   BMI is There is no height or weight on file to calculate BMI., he {HAS HAS URK:27062} been working on diet and exercise. Had set weight goal of 220 lb *** Wt Readings from Last 3 Encounters:  04/30/18 234 lb (106.1 kg)  02/26/18 239 lb 9.6 oz (108.7 kg)  01/25/18 248 lb 9.6 oz (112.8 kg)   His blood pressure {HAS HAS NOT:18834} been controlled at home, today their BP is    He {DOES_DOES BJS:28315} workout. He denies chest pain, shortness of breath, dizziness.   He {ACTION; IS/IS VVO:16073710} on cholesterol medication (lipitor ***) and denies myalgias. His cholesterol {ACTION; IS/IS NOT:21021397} at goal. The cholesterol  last visit was:   Lab Results  Component Value Date   CHOL 201 (H) 01/25/2018   HDL 50 01/25/2018   LDLCALC 126 (H) 01/25/2018   TRIG 131 01/25/2018   CHOLHDL 4.0 01/25/2018    He {Has/has not:18111} been working on diet and exercise for T2DM, on metformin ***, farxiga, rebelsys *** and denies {Symptoms; diabetes w/o none:19199}.  Last A1C in the office was:  Lab Results  Component Value Date   HGBA1C 10.1 (H) 01/25/2018   Patient is on Vitamin D supplement.   Lab Results  Component Value Date   VD25OH 15 (L) 12/31/2015       Past Medical History:  Diagnosis Date  . Allergy   . Hyperlipidemia   . Hypertension   . Obesity (BMI 30-39.9)   . Type II or unspecified type diabetes mellitus without mention of complication, not stated as uncontrolled   . Vitamin D deficiency      No Known Allergies  Current Outpatient Medications on File Prior to Visit  Medication Sig  . atorvastatin (LIPITOR) 80 MG tablet TAKE 1 TABLET BY MOUTH EVERY DAY  . Cholecalciferol (VITAMIN D PO) Take 5,000 Int'l Units by mouth daily.  . dapagliflozin propanediol (FARXIGA) 10 MG TABS tablet TAKE 1 TABLET BY MOUTH EVERY DAY FOR DIABETES. NEED TO BE SEEN PRIOR TO FURTHER REFILLS.  Marland Kitchen glucose blood (FREESTYLE LITE) test strip Test sugar once daily DX E11.9  .  losartan (COZAAR) 100 MG tablet Take 1 tablet Daily for BP - Need Office Visit before further Refills  . metFORMIN (GLUCOPHAGE-XR) 500 MG 24 hr tablet TAKE 1 TO 2 TABLETS BY MOUTH TWICE DAILY FOR DIABETES  . Semaglutide (RYBELSUS) 7 MG TABS TAKE 1 TABLET BY MOUTH EVERY MORNING. NEED TO BE SEEN IN OFFICE STAT - NO REFILLS UNTIL OV.   No current facility-administered medications on file prior to visit.    ROS: all negative except above.   Physical Exam: There were no vitals filed for this visit. There were no vitals taken for this visit. General Appearance: Well nourished, in no apparent distress. Eyes: PERRLA, EOMs, conjunctiva no swelling or  erythema Sinuses: No Frontal/maxillary tenderness ENT/Mouth: Ext aud canals clear, TMs without erythema, bulging. No erythema, swelling, or exudate on post pharynx.  Tonsils not swollen or erythematous. Hearing normal.  Neck: Supple, thyroid normal.  Respiratory: Respiratory effort normal, BS equal bilaterally without rales, rhonchi, wheezing or stridor.  Cardio: RRR with no MRGs. Brisk peripheral pulses without edema.  Abdomen: Soft, + BS.  Non tender, no guarding, rebound, hernias, masses. Lymphatics: Non tender without lymphadenopathy.  Musculoskeletal: Full ROM, 5/5 strength, normal gait.  Skin: Warm, dry without rashes, lesions, ecchymosis.  Neuro: Cranial nerves intact. Normal muscle tone, no cerebellar symptoms. Sensation intact.  Psych: Awake and oriented X 3, normal affect, Insight and Judgment appropriate.     Dan Maker, NP 9:56 AM [facility]

## 2020-02-27 ENCOUNTER — Ambulatory Visit: Payer: BC Managed Care – PPO | Admitting: Adult Health

## 2020-02-27 DIAGNOSIS — E559 Vitamin D deficiency, unspecified: Secondary | ICD-10-CM

## 2020-02-27 DIAGNOSIS — I10 Essential (primary) hypertension: Secondary | ICD-10-CM

## 2020-02-27 DIAGNOSIS — E1165 Type 2 diabetes mellitus with hyperglycemia: Secondary | ICD-10-CM

## 2020-02-27 DIAGNOSIS — E1169 Type 2 diabetes mellitus with other specified complication: Secondary | ICD-10-CM

## 2020-02-28 DIAGNOSIS — E119 Type 2 diabetes mellitus without complications: Secondary | ICD-10-CM | POA: Diagnosis not present

## 2020-03-09 ENCOUNTER — Ambulatory Visit: Payer: BC Managed Care – PPO | Admitting: Adult Health

## 2020-03-15 NOTE — Progress Notes (Signed)
3 MONTH FOLLOW UP  Assessment and Plan:  Natalie was seen today for follow-up.  Diagnoses and all orders for this visit:  Primary hypertension Continue medication: losartan 100 mg Reports well controlled if takes consistently; restart Monitor blood pressure at home; call if consistently over 130/80 Continue DASH diet.   Reminder to go to the ER if any CP, SOB, nausea, dizziness, severe HA, changes vision/speech, left arm numbness and tingling and jaw pain. -     CBC with Differential/Platelet -     COMPLETE METABOLIC PANEL WITH GFR -     Magnesium  Uncontrolled type 2 diabetes mellitus with hyperglycemia [facility]) Education: Reviewed 'ABCs' of diabetes management (respective goals in parentheses):  A1C (<7), blood pressure (<130/80), and cholesterol (LDL <70) Eye Exam yearly and Dental Exam every 6 months. Dietary recommendations Physical Activity recommendations Will try to simplify regimen; Sent in metformin 1000 mg tabs STOP rebelsys, not taking farxiga START ozempic and titrate per instructions, coupon given  Close follow up 4-6 weeks then increase to 1 mg /week if tolerating well -     metFORMIN (GLUMETZA) 1000 MG (MOD) 24 hr tablet; Take 1 tab twice daily with meals for diabetes. -     glucose blood (FREESTYLE LITE) test strip; Test sugar once daily DX E11.9 -     COMPLETE METABOLIC PANEL WITH GFR -     Hemoglobin A1c -     Semaglutide,0.25 or 0.5MG /DOS, (OZEMPIC, 0.25 OR 0.5 MG/DOSE,) 2 MG/1.5ML SOPN; Inject 0.25 mg into your skin weekly for 2 weeks, then increase to 0.5 mg weekly.  Hyperlipidemia associated with type 2 diabetes mellitus (HCC) Start atorvastatin 40 mg daily  LDL goal <70 Continue low cholesterol diet and exercise.  Check lipid panel.  -     atorvastatin (LIPITOR) 40 MG tablet; Take 1 tablets (40 mg total) by mouth daily. -     Lipid panel -     TSH  Morbid obesity (HCC) Long discussion about weight loss, diet, and exercise Recommended diet heavy in  fruits and veggies and low in animal meats, cheeses, and dairy products, appropriate calorie intake Patient will work on cooking at home, start pelaton 10-15 min BID, plan into schedule Discussed appropriate weight for height Follow up at next visit  Elevated liver function tests Plan on [place] if remains elevated Weight loss advised, avoid alcohol/tylenol, will monitor LFTs -     COMPLETE METABOLIC PANEL WITH GFR  Vitamin D deficiency Restart supplement; defer checking today  Future Appointments  Date Time Provider Department Center  04/26/2020  3:00 PM Elder Negus, NP [facility] None     HPI 35 y.o.male presents for follow up for HTN, chol, DM and obesity. He was lost to follow up over the pandemic, last labs were 01/25/2018. Works from home for [employer], has 3 y/o son at home that keeps him busy.   BMI is Body mass index is 34.85 kg/m., he has been working on diet and exercise, reports was down to 228 lb on home scale a few weeks, but admits got busy at work and got off track.  Had set weight goal of 220 lb. He has been trying to push water, does also drink diet drinks (willing to stop), trying to work up to walking daily, has pelaton but hasn't really used. Trying to cook more from home. Admits struggles when eats out or take out. Trying to eat more green veggies, less red meat, avoid starches.  Wt Readings from Last 3 Encounters:  03/16/20 236 lb (107 kg)  04/30/18 234 lb (106.1 kg)  02/26/18 239 lb 9.6 oz (108.7 kg)   His blood pressure has been controlled at home when taking med regularly, today their BP is BP: (!) 136/94. Admits has only taken 2 days in the last week.   He does workout. He denies chest pain, shortness of breath, dizziness.   He is on cholesterol medication (lipitor 80 mg but only taking a few days per week) and denies myalgias. His cholesterol is not at goal. The cholesterol last visit was:   Lab Results  Component Value Date   CHOL 201 (H)  01/25/2018   HDL 50 01/25/2018   LDLCALC 126 (H) 01/25/2018   TRIG 131 01/25/2018   CHOLHDL 4.0 01/25/2018    He has been working on diet and exercise for T2DM, on metformin 1000 mg BID, farxiga (hasn't taken, would like to stop), rebelsys (inconsistent, interested in ozempic) and denies foot ulcerations, increased appetite, nausea, paresthesia of the feet, polydipsia, polyuria, visual disturbances, vomiting and weight loss. He has glucometer, needs strips, hasn't been checking.  He is est with vision provider, has upcoming appointment, will get diabetic eye exam.  Last A1C in the office was:  Lab Results  Component Value Date   HGBA1C 10.1 (H) 01/25/2018   Patient is not currently on Vitamin D supplement.   Lab Results  Component Value Date   VD25OH 15 (L) 12/31/2015     Had LFT workup labs in 2018 - hepatitis panel, alpha 1 antitrypsin, ANA, anti DNA ab, anti smooth muscle ab, ceruloplasmin, mitochondrial antibodies that were negative.  Hasn't had [place].  Lab Results  Component Value Date   ALT 77 (H) 01/25/2018   AST 35 01/25/2018   ALKPHOS 65 12/31/2015   BILITOT 0.5 01/25/2018     Past Medical History:  Diagnosis Date  . Allergy   . Hyperlipidemia   . Hypertension   . Obesity (BMI 30-39.9)   . Type II or unspecified type diabetes mellitus without mention of complication, not stated as uncontrolled   . Vitamin D deficiency      No Known Allergies  Current Outpatient Medications on File Prior to Visit  Medication Sig  . losartan (COZAAR) 100 MG tablet Take 1 tablet Daily for BP - Need Office Visit before further Refills  . Cholecalciferol (VITAMIN D PO) Take 5,000 Int'l Units by mouth daily. (Patient not taking: Reported on 03/16/2020)   No current facility-administered medications on file prior to visit.    ROS: all negative except above.   Physical Exam: Filed Weights   03/16/20 1109  Weight: 236 lb (107 kg)   BP (!) 136/94   Pulse (!) 107   Temp 97.7 F  (36.5 C)   Wt 236 lb (107 kg)   SpO2 96%   BMI 34.85 kg/m  General Appearance: Well nourished, well dressed AA adult male in no apparent distress. Eyes: PERRLA, EOMs, conjunctiva no swelling or erythema Sinuses: No Frontal/maxillary tenderness ENT/Mouth: Ext aud canals clear, TMs without erythema, bulging. No erythema, swelling, or exudate on post pharynx.  Tonsils not swollen or erythematous. Hearing normal.  Neck: Supple, thyroid normal.  Respiratory: Respiratory effort normal, BS equal bilaterally without rales, rhonchi, wheezing or stridor.  Cardio: RRR with no MRGs. Brisk peripheral pulses without edema.  Abdomen: Soft, + BS.  Non tender, no guarding, rebound, hernias, masses. Lymphatics: Non tender without lymphadenopathy.  Musculoskeletal: Full ROM, 5/5 strength, normal gait.  Skin: Warm, dry without  rashes, lesions, ecchymosis.  Neuro: Cranial nerves intact. Normal muscle tone, no cerebellar symptoms. Sensation intact.  Psych: Awake and oriented X 3, normal affect, Insight and Judgment appropriate.     Dan Maker, NP 1:06 PM [facility]

## 2020-03-16 ENCOUNTER — Other Ambulatory Visit: Payer: Self-pay

## 2020-03-16 ENCOUNTER — Other Ambulatory Visit: Payer: Self-pay | Admitting: Adult Health

## 2020-03-16 ENCOUNTER — Encounter: Payer: Self-pay | Admitting: Adult Health

## 2020-03-16 ENCOUNTER — Ambulatory Visit (INDEPENDENT_AMBULATORY_CARE_PROVIDER_SITE_OTHER): Payer: BC Managed Care – PPO | Admitting: Adult Health

## 2020-03-16 VITALS — BP 136/94 | HR 107 | Temp 97.7°F | Wt 236.0 lb

## 2020-03-16 DIAGNOSIS — Z79899 Other long term (current) drug therapy: Secondary | ICD-10-CM

## 2020-03-16 DIAGNOSIS — E1169 Type 2 diabetes mellitus with other specified complication: Secondary | ICD-10-CM

## 2020-03-16 DIAGNOSIS — E785 Hyperlipidemia, unspecified: Secondary | ICD-10-CM

## 2020-03-16 DIAGNOSIS — E559 Vitamin D deficiency, unspecified: Secondary | ICD-10-CM

## 2020-03-16 DIAGNOSIS — I10 Essential (primary) hypertension: Secondary | ICD-10-CM

## 2020-03-16 DIAGNOSIS — R7989 Other specified abnormal findings of blood chemistry: Secondary | ICD-10-CM

## 2020-03-16 DIAGNOSIS — E1165 Type 2 diabetes mellitus with hyperglycemia: Secondary | ICD-10-CM

## 2020-03-16 DIAGNOSIS — Z91199 Patient's noncompliance with other medical treatment and regimen due to unspecified reason: Secondary | ICD-10-CM

## 2020-03-16 DIAGNOSIS — Z9119 Patient's noncompliance with other medical treatment and regimen: Secondary | ICD-10-CM

## 2020-03-16 MED ORDER — METFORMIN HCL ER 500 MG PO TB24: ORAL_TABLET | ORAL | 0 refills | Status: DC

## 2020-03-16 MED ORDER — METFORMIN HCL ER (MOD) 1000 MG PO TB24
ORAL_TABLET | ORAL | 0 refills | Status: DC
Start: 2020-03-16 — End: 2020-03-16

## 2020-03-16 MED ORDER — FREESTYLE LITE TEST VI STRP
ORAL_STRIP | 3 refills | Status: DC
Start: 2020-03-16 — End: 2022-01-24

## 2020-03-16 MED ORDER — ATORVASTATIN CALCIUM 40 MG PO TABS: 80.0000 mg | ORAL_TABLET | Freq: Every day | ORAL | 0 refills | Status: DC

## 2020-03-16 MED ORDER — ATORVASTATIN CALCIUM 40 MG PO TABS: 40.0000 mg | ORAL_TABLET | Freq: Every day | ORAL | 0 refills | Status: DC

## 2020-03-16 MED ORDER — OZEMPIC (0.25 OR 0.5 MG/DOSE) 2 MG/1.5ML ~~LOC~~ SOPN: PEN_INJECTOR | SUBCUTANEOUS | 0 refills | Status: DC

## 2020-03-16 NOTE — Patient Instructions (Addendum)
Goals    . Blood Pressure < 130/80    . fasting glucose <130       Start ozempic 0.25 mg into skin of stomach once weekly for 2-4 weeks (increase at 2 weeks if no side effects), then do 0.5 mg weekly  Contact me if needing refill prior to follow up  Check fasting glucose - a few days a week or daily if that is easier   Semaglutide Oral Tablets What is this medicine? SEMAGLUTIDE (Sem a GLOO tide) controls blood sugar in people with type 2 diabetes. It is used with lifestyle changes like diet and exercise. This medicine may be used for other purposes; ask your health care provider or pharmacist if you have questions. COMMON BRAND NAME(S): Rybelsus What should I tell my health care provider before I take this medicine? They need to know if you have any of these conditions:  endocrine tumors (MEN 2) or if someone in your family had these tumors  eye disease  history of pancreatitis  kidney disease  stomach or intestine problems  thyroid cancer or if someone in your family had thyroid cancer  vision problems  an unusual or allergic reaction to semaglutide, other medicines, foods, dyes, or preservatives  pregnant or trying to get pregnant  breast-feeding How should I use this medicine? Take this medicine by mouth. Take it as directed on the prescription label at the same time every day. Take the dose right after waking up. Do not eat or drink anything before taking it. Do not take it with any other drink except a glass of plain water that is less than 4 ounces (less than 120 mL). Do not cut, crush or chew this medicine. Swallow the tablets whole. After taking it, do not eat breakfast, drink, or take any other medicines or vitamins for at least 30 minutes. Keep taking it unless your health care provider tells you to stop. A special MedGuide will be given to you by the pharmacist with each prescription and refill. Be sure to read this information carefully each time. Talk to your  health care provider about the use of this medicine in children. Special care may be needed. Overdosage: If you think you have taken too much of this medicine contact a poison control center or emergency room at once. NOTE: This medicine is only for you. Do not share this medicine with others. What if I miss a dose? If you miss a dose, skip it. Take your next dose at the normal time. Do not take extra or 2 doses at the same time to make up for the missed dose. What may interact with this medicine? What may interact with this medicine?  aminophylline  carbamazepine  cyclosporine  digoxin  levothyroxine  other medicines for diabetes  phenytoin  tacrolimus  theophylline  warfarin Many medications may cause changes in blood sugar, these include:  alcohol containing beverages  antiviral medicines for HIV or AIDS  aspirin and aspirin-like drugs  certain medicines for blood pressure, heart disease, irregular heart beat  chromium  diuretics  male hormones, such as estrogens or progestins, birth control pills  fenofibrate  gemfibrozil  isoniazid  lanreotide  male hormones or anabolic steroids  MAOIs like Carbex, Eldepryl, Marplan, Nardil, and Parnate  medicines for weight loss  medicines for allergies, asthma, cold, or cough  medicines for depression, anxiety, or psychotic disturbances  niacin  nicotine  NSAIDs, medicines for pain and inflammation, like ibuprofen or naproxen  octreotide  pasireotide  pentamidine  phenytoin  probenecid  quinolone antibiotics such as ciprofloxacin, levofloxacin, ofloxacin  some herbal dietary supplements  steroid medicines such as prednisone or cortisone  sulfamethoxazole; trimethoprim  thyroid hormones Some medications can hide the warning symptoms of low blood sugar (hypoglycemia). You may need to monitor your blood sugar more closely if you are taking one of these medications. These  include:  beta-blockers, often used for high blood pressure or heart problems (examples include atenolol, metoprolol, propranolol)  clonidine  guanethidine  reserpine This list may not describe all possible interactions. Give your health care provider a list of all the medicines, herbs, non-prescription drugs, or dietary supplements you use. Also tell them if you smoke, drink alcohol, or use illegal drugs. Some items may interact with your medicine. What should I watch for while using this medicine? Visit your health care provider for regular checks on your progress. Check with your health care provider if you have severe diarrhea, nausea, and vomiting, or if you sweat a lot. The loss of too much body fluid may make it dangerous for you to take this medicine. A test called the HbA1C (A1C) will be monitored. This is a simple blood test. It measures your blood sugar control over the last 2 to 3 months. You will receive this test every 3 to 6 months. Learn how to check your blood sugar. Learn the symptoms of low and high blood sugar and how to manage them. Always carry a quick-source of sugar with you in case you have symptoms of low blood sugar. Examples include hard sugar candy or glucose tablets. Make sure others know that you can choke if you eat or drink when you develop serious symptoms of low blood sugar, such as seizures or unconsciousness. Get medical help at once. Tell your health care provider if you have high blood sugar. You might need to change the dose of your medicine. If you are sick or exercising more than usual, you might need to change the dose of your medicine. Do not skip meals. Ask your health care provider if you should avoid alcohol. Many nonprescription cough and cold products contain sugar or alcohol. These can affect blood sugar. Wear a medical ID bracelet or chain. Carry a card that describes your condition. List the medicines and doses you take on the card. Do not become  pregnant while taking this medicine. Women should inform their health care provider if they wish to become pregnant or think they might be pregnant. There is a potential for serious side effects to an unborn child. Talk to your health care provider for more information. Do not breast-feed an infant while taking this medicine. What side effects may I notice from receiving this medicine? Side effects that you should report to your doctor or health care provider as soon as possible:  allergic reactions (skin rash, itching or hives; swelling of the face, lips, or tongue)  changes in vision  diarrhea that continues or is severe  infection (fever, chills, cough, sore throat, pain or trouble passing urine)  kidney injury (trouble passing urine or change in the amount of urine)  low blood sugar (feeling anxious; confusion; dizziness; increased hunger; unusually weak or tired; increased sweating; shakiness; cold, clammy skin; irritable; headache; blurred vision; fast heartbeat; loss of consciousness)  lump or swelling on the neck  painful or difficulty swallowing  severe nausea  severe or unusual stomach pain  trouble breathing  vomiting Side effects that usually do not require medical attention (report  these to your doctor or health care provider if they continue or are bothersome):  constipation  diarrhea  nausea  upset stomach This list may not describe all possible side effects. Call your doctor for medical advice about side effects. You may report side effects to FDA at 1-800-FDA-1088. Where should I keep my medicine? Keep out of the reach of children and pets. Store at room temperature between 20 and 25 degrees C (68 and 77 degrees F). Keep this medicine in the original container. Protect from moisture. Keep the container tightly closed. Get rid of any unused medicine after the expiration date. To get rid of medicines that are no longer needed or have expired:  Take the  medicine to a medicine take-back program. Check with your pharmacy or law enforcement to find a location.  If you cannot return the medicine, check the label or package insert to see if the medicine should be thrown out in the garbage or flushed down the toilet. If you are not sure, ask your health care provider. If it is safe to put it in the trash, take the medicine out of the container. Mix the medicine with cat litter, dirt, coffee grounds, or other unwanted substance. Seal the mixture in a bag or container. Put it in the trash. NOTE: This sheet is a summary. It may not cover all possible information. If you have questions about this medicine, talk to your doctor, pharmacist, or health care provider.  2021 Elsevier/Gold Standard (2019-12-12 15:08:33)

## 2020-03-17 ENCOUNTER — Other Ambulatory Visit: Payer: Self-pay | Admitting: Adult Health

## 2020-03-17 DIAGNOSIS — R7989 Other specified abnormal findings of blood chemistry: Secondary | ICD-10-CM

## 2020-03-17 LAB — COMPLETE METABOLIC PANEL WITH GFR
AG Ratio: 1.8 (calc) (ref 1.0–2.5)
ALT: 71 U/L — ABNORMAL HIGH (ref 9–46)
AST: 30 U/L (ref 10–40)
Albumin: 4.8 g/dL (ref 3.6–5.1)
Alkaline phosphatase (APISO): 87 U/L (ref 36–130)
BUN: 12 mg/dL (ref 7–25)
CO2: 26 mmol/L (ref 20–32)
Calcium: 10 mg/dL (ref 8.6–10.3)
Chloride: 98 mmol/L (ref 98–110)
Creat: 0.88 mg/dL (ref 0.60–1.35)
GFR, Est African American: 129 mL/min/{1.73_m2} (ref >=60)
GFR, Est Non African American: 111 mL/min/{1.73_m2} (ref >=60)
Globulin: 2.6 g/dL (calc) (ref 1.9–3.7)
Glucose, Bld: 346 mg/dL — ABNORMAL HIGH (ref 65–99)
Potassium: 4.2 mmol/L (ref 3.5–5.3)
Sodium: 136 mmol/L (ref 135–146)
Total Bilirubin: 0.7 mg/dL (ref 0.2–1.2)
Total Protein: 7.4 g/dL (ref 6.1–8.1)

## 2020-03-17 LAB — CBC WITH DIFFERENTIAL/PLATELET
Absolute Monocytes: 298 cells/uL (ref 200–950)
Basophils Absolute: 21 cells/uL (ref 0–200)
Basophils Relative: 0.6 %
Eosinophils Absolute: 32 cells/uL (ref 15–500)
Eosinophils Relative: 0.9 %
HCT: 44.6 % (ref 38.5–50.0)
Hemoglobin: 15.2 g/dL (ref 13.2–17.1)
Lymphs Abs: 1376 cells/uL (ref 850–3900)
MCH: 28.7 pg (ref 27.0–33.0)
MCHC: 34.1 g/dL (ref 32.0–36.0)
MCV: 84.3 fL (ref 80.0–100.0)
MPV: 11.5 fL (ref 7.5–12.5)
Monocytes Relative: 8.5 %
Neutro Abs: 1775 cells/uL (ref 1500–7800)
Neutrophils Relative %: 50.7 %
Platelets: 289 10*3/uL (ref 140–400)
RBC: 5.29 10*6/uL (ref 4.20–5.80)
RDW: 12.4 % (ref 11.0–15.0)
Total Lymphocyte: 39.3 %
WBC: 3.5 10*3/uL — ABNORMAL LOW (ref 3.8–10.8)

## 2020-03-17 LAB — LIPID PANEL
Cholesterol: 249 mg/dL — ABNORMAL HIGH (ref <200)
HDL: 49 mg/dL (ref >=40)
LDL Cholesterol (Calc): 162 mg/dL (calc) — ABNORMAL HIGH
Non-HDL Cholesterol (Calc): 200 mg/dL (calc) — ABNORMAL HIGH (ref <130)
Total CHOL/HDL Ratio: 5.1 (calc) — ABNORMAL HIGH (ref <5.0)
Triglycerides: 214 mg/dL — ABNORMAL HIGH (ref <150)

## 2020-03-17 LAB — HEMOGLOBIN A1C: Hgb A1c MFr Bld: >14 % of total Hgb — ABNORMAL HIGH (ref <5.7)

## 2020-03-17 LAB — TSH: TSH: 1.19 mIU/L (ref 0.40–4.50)

## 2020-03-17 LAB — MAGNESIUM: Magnesium: 1.9 mg/dL (ref 1.5–2.5)

## 2020-03-21 ENCOUNTER — Other Ambulatory Visit: Payer: Self-pay | Admitting: Adult Health

## 2020-03-21 MED ORDER — METFORMIN HCL ER 500 MG PO TB24: ORAL_TABLET | ORAL | 0 refills | Status: DC

## 2020-03-27 DIAGNOSIS — E119 Type 2 diabetes mellitus without complications: Secondary | ICD-10-CM | POA: Diagnosis not present

## 2020-04-05 ENCOUNTER — Ambulatory Visit
Admission: RE | Admit: 2020-04-05 | Discharge: 2020-04-05 | Disposition: A | Discharge status: Home / Self Care | Payer: BC Managed Care – PPO | Source: Ambulatory Visit | Attending: Adult Health | Admitting: Adult Health

## 2020-04-05 DIAGNOSIS — R7989 Other specified abnormal findings of blood chemistry: Secondary | ICD-10-CM

## 2020-04-05 DIAGNOSIS — K7689 Other specified diseases of liver: Secondary | ICD-10-CM | POA: Diagnosis not present

## 2020-04-06 ENCOUNTER — Encounter: Payer: Self-pay | Admitting: Adult Health

## 2020-04-07 ENCOUNTER — Other Ambulatory Visit: Payer: Self-pay | Admitting: Adult Health

## 2020-04-07 DIAGNOSIS — I10 Essential (primary) hypertension: Secondary | ICD-10-CM

## 2020-04-26 ENCOUNTER — Encounter: Payer: BC Managed Care – PPO | Admitting: Adult Health Nurse Practitioner

## 2020-04-27 ENCOUNTER — Ambulatory Visit: Payer: BC Managed Care – PPO | Admitting: Adult Health

## 2020-04-27 DIAGNOSIS — E119 Type 2 diabetes mellitus without complications: Secondary | ICD-10-CM | POA: Diagnosis not present

## 2020-04-30 ENCOUNTER — Other Ambulatory Visit: Payer: Self-pay

## 2020-04-30 DIAGNOSIS — E1165 Type 2 diabetes mellitus with hyperglycemia: Secondary | ICD-10-CM

## 2020-04-30 MED ORDER — OZEMPIC (0.25 OR 0.5 MG/DOSE) 2 MG/1.5ML ~~LOC~~ SOPN: PEN_INJECTOR | SUBCUTANEOUS | 0 refills | Status: DC

## 2020-05-05 ENCOUNTER — Other Ambulatory Visit: Payer: Self-pay | Admitting: Adult Health

## 2020-05-05 DIAGNOSIS — E785 Hyperlipidemia, unspecified: Secondary | ICD-10-CM

## 2020-05-05 DIAGNOSIS — E1169 Type 2 diabetes mellitus with other specified complication: Secondary | ICD-10-CM

## 2020-05-18 ENCOUNTER — Ambulatory Visit: Payer: BC Managed Care – PPO | Admitting: Adult Health

## 2020-05-18 ENCOUNTER — Other Ambulatory Visit: Payer: Self-pay

## 2020-05-18 DIAGNOSIS — E1165 Type 2 diabetes mellitus with hyperglycemia: Secondary | ICD-10-CM

## 2020-05-18 MED ORDER — OZEMPIC (0.25 OR 0.5 MG/DOSE) 2 MG/1.5ML ~~LOC~~ SOPN: PEN_INJECTOR | SUBCUTANEOUS | 0 refills | Status: DC

## 2020-05-21 ENCOUNTER — Other Ambulatory Visit: Payer: Self-pay

## 2020-05-21 DIAGNOSIS — E1165 Type 2 diabetes mellitus with hyperglycemia: Secondary | ICD-10-CM

## 2020-05-21 MED ORDER — OZEMPIC (0.25 OR 0.5 MG/DOSE) 2 MG/1.5ML ~~LOC~~ SOPN: PEN_INJECTOR | SUBCUTANEOUS | 0 refills | Status: DC

## 2020-05-22 ENCOUNTER — Telehealth: Payer: Self-pay

## 2020-05-22 NOTE — Telephone Encounter (Signed)
Patient is requesting an alternative to Ozempic, costs too much, cannot afford it.

## 2020-05-22 NOTE — Telephone Encounter (Signed)
Spoke with patient. He tried to use the coupon along with insurance but it was $900.00. Advised patient that I will call the drug rep to help assist.

## 2020-05-27 DIAGNOSIS — E119 Type 2 diabetes mellitus without complications: Secondary | ICD-10-CM | POA: Diagnosis not present

## 2020-05-29 NOTE — Telephone Encounter (Signed)
No luck with Ozempic being covered by insurance.

## 2020-06-22 ENCOUNTER — Ambulatory Visit: Payer: BC Managed Care – PPO | Admitting: Adult Health

## 2020-06-27 DIAGNOSIS — E119 Type 2 diabetes mellitus without complications: Secondary | ICD-10-CM | POA: Diagnosis not present

## 2020-06-28 ENCOUNTER — Other Ambulatory Visit: Payer: Self-pay | Admitting: Internal Medicine

## 2020-06-28 MED ORDER — AZITHROMYCIN 250 MG PO TABS: ORAL_TABLET | ORAL | 0 refills | Status: DC

## 2020-06-28 MED ORDER — PROMETHAZINE-DM 6.25-15 MG/5ML PO SYRP: ORAL_SOLUTION | ORAL | 1 refills | Status: DC

## 2020-06-28 MED ORDER — DEXAMETHASONE 2 MG PO TABS: ORAL_TABLET | ORAL | 0 refills | Status: DC

## 2020-07-27 DIAGNOSIS — E119 Type 2 diabetes mellitus without complications: Secondary | ICD-10-CM | POA: Diagnosis not present

## 2020-07-29 DIAGNOSIS — E1165 Type 2 diabetes mellitus with hyperglycemia: Secondary | ICD-10-CM | POA: Diagnosis not present

## 2020-07-29 DIAGNOSIS — H6120 Impacted cerumen, unspecified ear: Secondary | ICD-10-CM | POA: Diagnosis not present

## 2020-08-10 ENCOUNTER — Encounter: Payer: BC Managed Care – PPO | Admitting: Adult Health

## 2020-08-27 DIAGNOSIS — E119 Type 2 diabetes mellitus without complications: Secondary | ICD-10-CM | POA: Diagnosis not present

## 2020-09-11 DIAGNOSIS — H6123 Impacted cerumen, bilateral: Secondary | ICD-10-CM | POA: Diagnosis not present

## 2020-09-16 ENCOUNTER — Other Ambulatory Visit: Payer: Self-pay | Admitting: Adult Health

## 2020-09-16 DIAGNOSIS — I10 Essential (primary) hypertension: Secondary | ICD-10-CM

## 2020-09-27 DIAGNOSIS — E119 Type 2 diabetes mellitus without complications: Secondary | ICD-10-CM | POA: Diagnosis not present

## 2020-10-20 DIAGNOSIS — H9201 Otalgia, right ear: Secondary | ICD-10-CM | POA: Diagnosis not present

## 2020-10-25 DIAGNOSIS — H9201 Otalgia, right ear: Secondary | ICD-10-CM | POA: Diagnosis not present

## 2020-10-27 DIAGNOSIS — E119 Type 2 diabetes mellitus without complications: Secondary | ICD-10-CM | POA: Diagnosis not present

## 2020-10-27 DIAGNOSIS — Z7189 Other specified counseling: Secondary | ICD-10-CM | POA: Diagnosis not present

## 2020-11-13 DIAGNOSIS — H938X1 Other specified disorders of right ear: Secondary | ICD-10-CM | POA: Diagnosis not present

## 2020-11-16 DIAGNOSIS — H9041 Sensorineural hearing loss, unilateral, right ear, with unrestricted hearing on the contralateral side: Secondary | ICD-10-CM | POA: Diagnosis not present

## 2020-11-27 DIAGNOSIS — Z7189 Other specified counseling: Secondary | ICD-10-CM | POA: Diagnosis not present

## 2020-11-27 DIAGNOSIS — E119 Type 2 diabetes mellitus without complications: Secondary | ICD-10-CM | POA: Diagnosis not present

## 2020-11-28 DIAGNOSIS — Z23 Encounter for immunization: Secondary | ICD-10-CM | POA: Diagnosis not present

## 2020-12-14 ENCOUNTER — Encounter: Payer: BC Managed Care – PPO | Admitting: Adult Health

## 2020-12-27 DIAGNOSIS — E119 Type 2 diabetes mellitus without complications: Secondary | ICD-10-CM | POA: Diagnosis not present

## 2020-12-27 DIAGNOSIS — Z7189 Other specified counseling: Secondary | ICD-10-CM | POA: Diagnosis not present

## 2021-01-15 ENCOUNTER — Encounter: Payer: BC Managed Care – PPO | Admitting: Adult Health

## 2021-01-27 DIAGNOSIS — E119 Type 2 diabetes mellitus without complications: Secondary | ICD-10-CM | POA: Diagnosis not present

## 2021-02-21 ENCOUNTER — Encounter: Payer: Self-pay | Admitting: Adult Health

## 2021-02-27 DIAGNOSIS — E119 Type 2 diabetes mellitus without complications: Secondary | ICD-10-CM | POA: Diagnosis not present

## 2021-03-14 ENCOUNTER — Other Ambulatory Visit: Payer: Self-pay | Admitting: Nurse Practitioner

## 2021-03-27 DIAGNOSIS — E119 Type 2 diabetes mellitus without complications: Secondary | ICD-10-CM | POA: Diagnosis not present

## 2021-04-27 DIAGNOSIS — E119 Type 2 diabetes mellitus without complications: Secondary | ICD-10-CM | POA: Diagnosis not present

## 2021-05-10 ENCOUNTER — Encounter: Payer: Self-pay | Admitting: Adult Health

## 2021-05-27 DIAGNOSIS — E119 Type 2 diabetes mellitus without complications: Secondary | ICD-10-CM | POA: Diagnosis not present

## 2021-06-08 ENCOUNTER — Other Ambulatory Visit: Payer: Self-pay | Admitting: Nurse Practitioner

## 2021-06-08 DIAGNOSIS — I10 Essential (primary) hypertension: Secondary | ICD-10-CM

## 2021-06-27 DIAGNOSIS — E119 Type 2 diabetes mellitus without complications: Secondary | ICD-10-CM | POA: Diagnosis not present

## 2021-07-18 ENCOUNTER — Encounter: Payer: Self-pay | Admitting: Adult Health

## 2021-07-26 IMAGING — US US ABDOMEN COMPLETE
1 series · 14 of 25 positions shown · non-contrast
Comparison: None.

CLINICAL DATA: Persistently elevated LFTs. Obese, diabetic;
question hepatic steatosis.

EXAM:
ABDOMEN ULTRASOUND COMPLETE

[Series 1: us abdomen complete · 0.26mm/px · 14 of 93 slices shown]
[im 1/93]
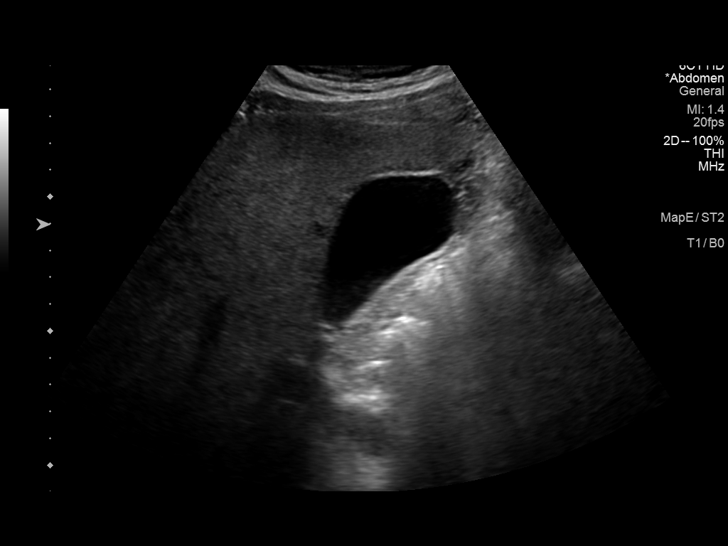
[im 8/93]
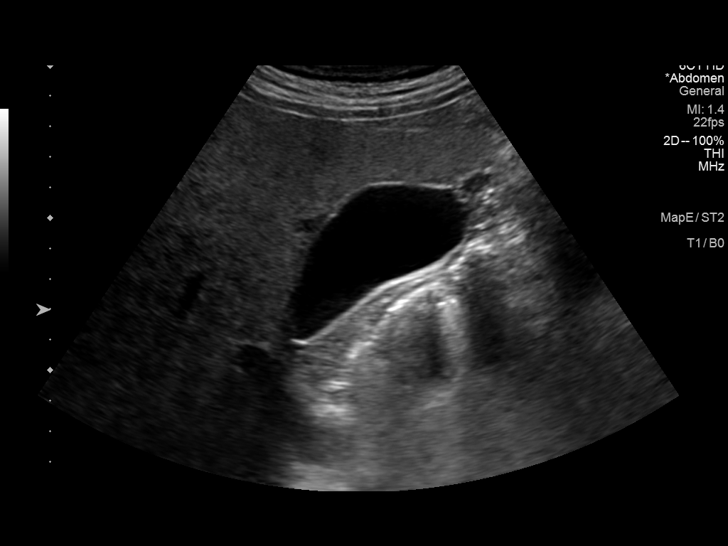
[im 16/93]
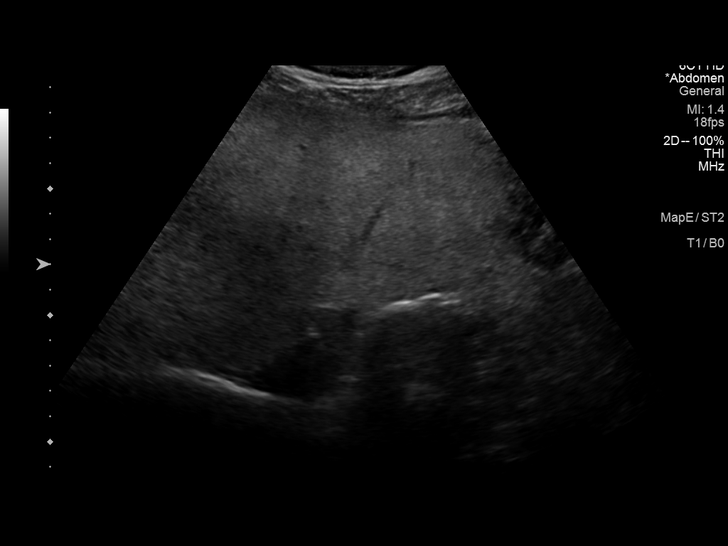
[im 24/93]
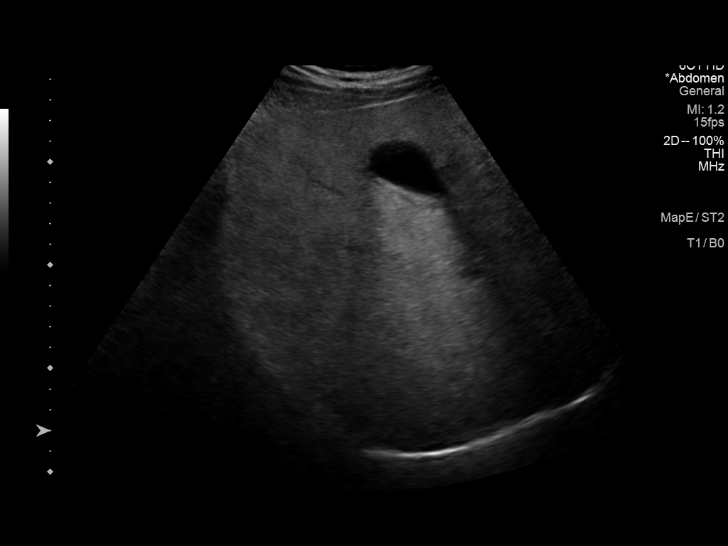
[im 31/93]
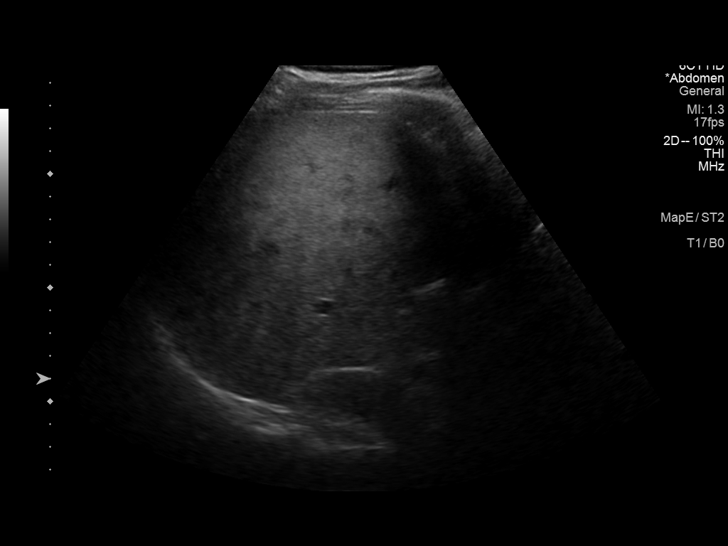
[im 35/93]
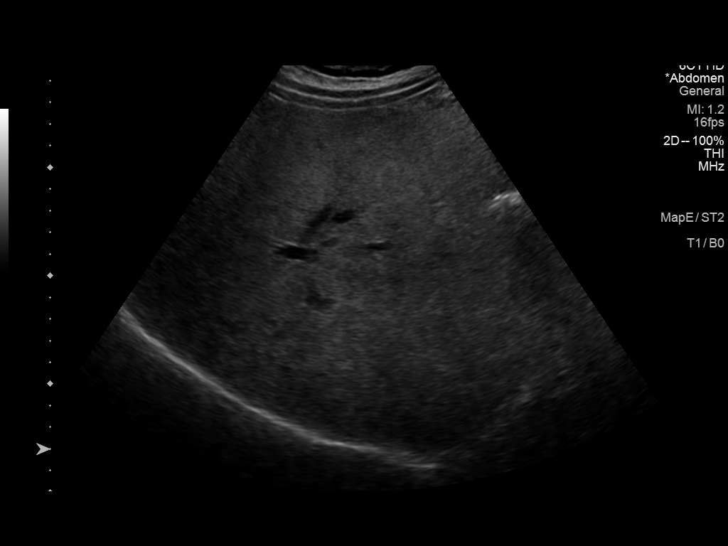
[im 43/93]
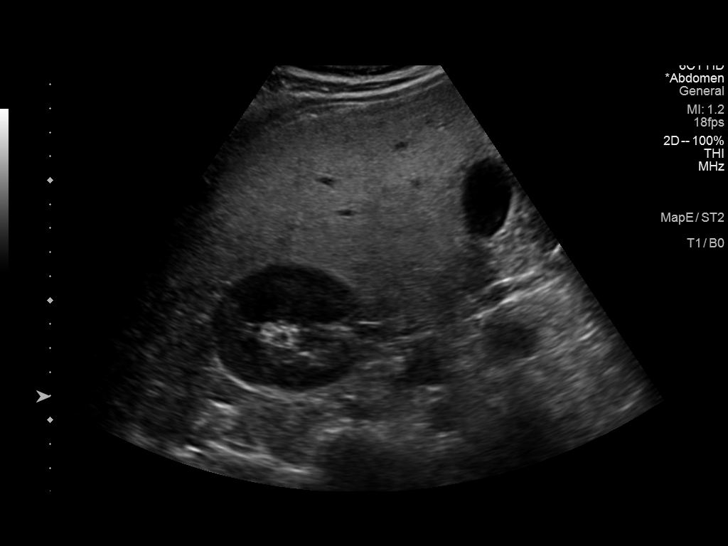
[im 50/93]
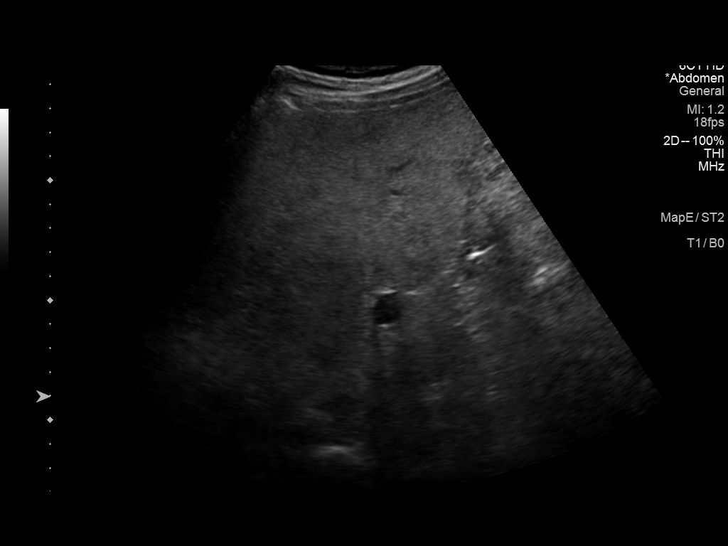
[im 58/93]
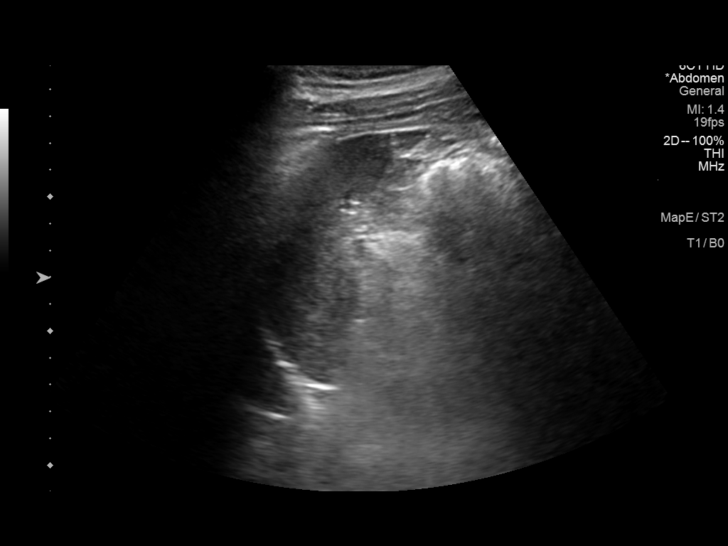
[im 62/93]
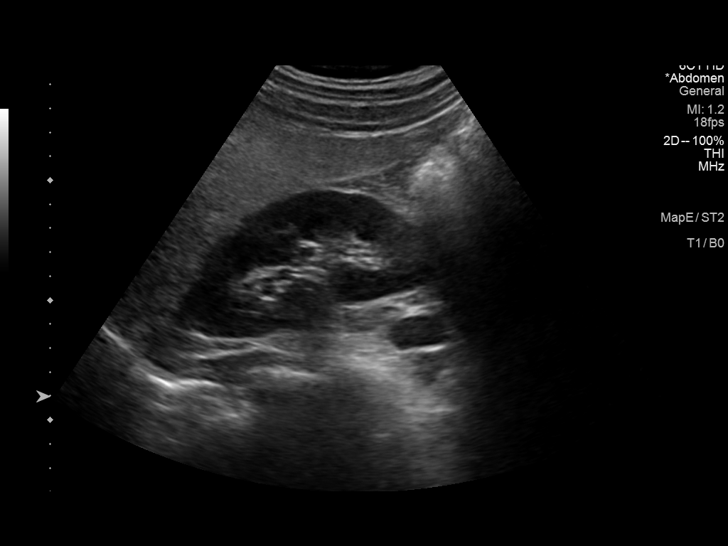
[im 70/93]
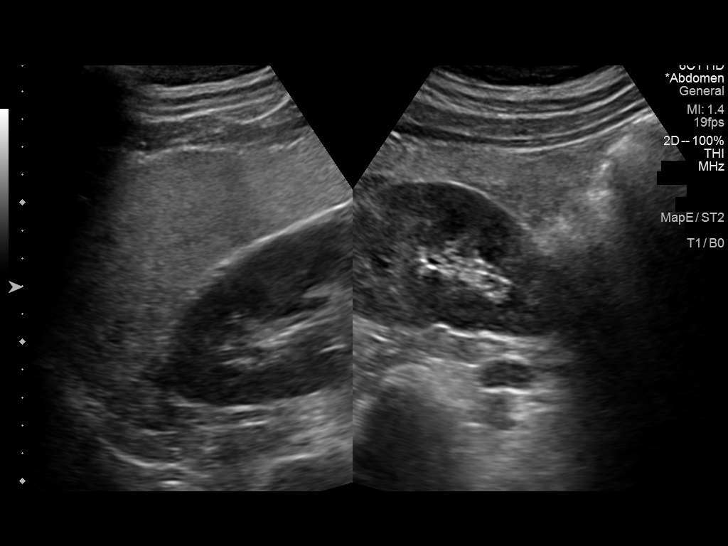
[im 77/93]
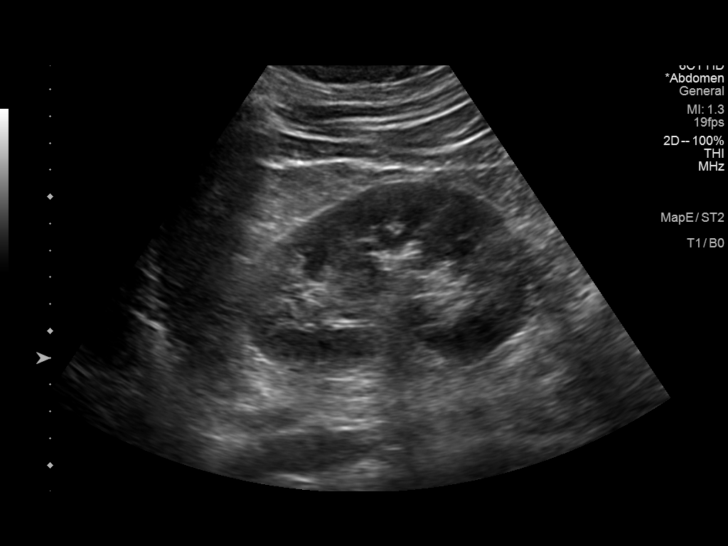
[im 85/93]
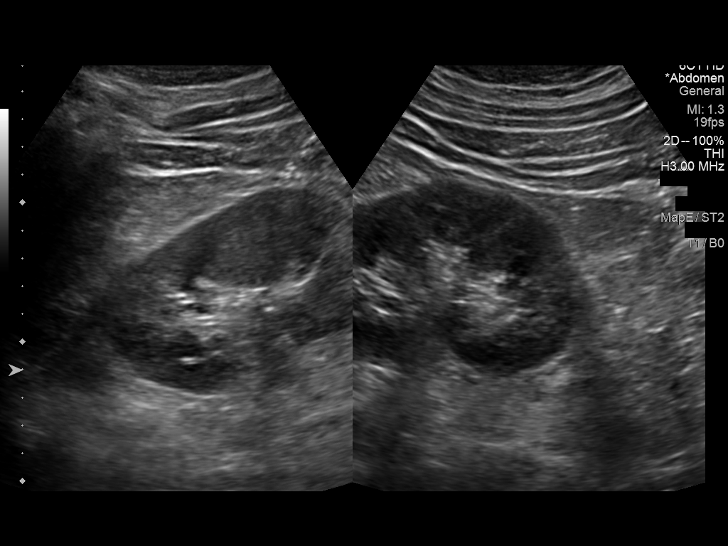
[im 93/93]
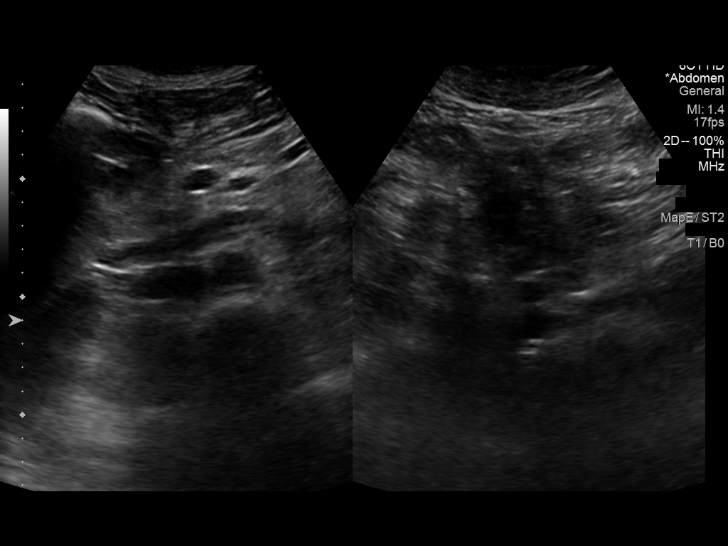

[14 of 25 positions shown; findings below may reference images not displayed]

FINDINGS: Gallbladder: No gallstones or wall thickening visualized. No
sonographic Murphy sign noted by sonographer.

Common bile duct: Diameter: 3.3 mm, within normal limits.

Liver: No focal lesion identified. Diffusely hyperechoic liver
parenchyma with heterogeneity. Portal vein is patent on color
Doppler imaging with normal direction of blood flow towards the
liver.

IVC: No abnormality visualized.

Pancreas: Visualized portion unremarkable. Mid body and tail are
obscured by bowel gas.

Spleen: Size and appearance within normal limits.

Right Kidney: Length: 12.3 cm. Echogenicity within normal limits. No
mass or hydronephrosis visualized.

Left Kidney: Length: 11.1 cm. Echogenicity within normal limits. No
mass or hydronephrosis visualized.

Abdominal aorta: No aneurysm visualized.
IMPRESSION: Diffusely increased liver echogenicity, which is nonspecific but
most commonly related to fatty infiltration.

## 2021-07-27 DIAGNOSIS — E119 Type 2 diabetes mellitus without complications: Secondary | ICD-10-CM | POA: Diagnosis not present

## 2021-08-13 ENCOUNTER — Encounter: Payer: BC Managed Care – PPO | Admitting: Adult Health

## 2021-08-27 DIAGNOSIS — E119 Type 2 diabetes mellitus without complications: Secondary | ICD-10-CM | POA: Diagnosis not present

## 2021-09-06 ENCOUNTER — Other Ambulatory Visit: Payer: Self-pay | Admitting: Nurse Practitioner

## 2021-09-24 DIAGNOSIS — L600 Ingrowing nail: Secondary | ICD-10-CM | POA: Diagnosis not present

## 2021-09-27 DIAGNOSIS — E119 Type 2 diabetes mellitus without complications: Secondary | ICD-10-CM | POA: Diagnosis not present

## 2021-10-18 ENCOUNTER — Encounter: Payer: BC Managed Care – PPO | Admitting: Nurse Practitioner

## 2021-10-27 DIAGNOSIS — E119 Type 2 diabetes mellitus without complications: Secondary | ICD-10-CM | POA: Diagnosis not present

## 2021-11-27 DIAGNOSIS — E119 Type 2 diabetes mellitus without complications: Secondary | ICD-10-CM | POA: Diagnosis not present

## 2021-12-27 DIAGNOSIS — E119 Type 2 diabetes mellitus without complications: Secondary | ICD-10-CM | POA: Diagnosis not present

## 2021-12-30 DIAGNOSIS — H6592 Unspecified nonsuppurative otitis media, left ear: Secondary | ICD-10-CM | POA: Diagnosis not present

## 2021-12-30 DIAGNOSIS — H9311 Tinnitus, right ear: Secondary | ICD-10-CM | POA: Diagnosis not present

## 2021-12-30 DIAGNOSIS — H6121 Impacted cerumen, right ear: Secondary | ICD-10-CM | POA: Diagnosis not present

## 2021-12-30 DIAGNOSIS — H6691 Otitis media, unspecified, right ear: Secondary | ICD-10-CM | POA: Diagnosis not present

## 2022-01-08 DIAGNOSIS — H6981 Other specified disorders of Eustachian tube, right ear: Secondary | ICD-10-CM | POA: Diagnosis not present

## 2022-01-08 DIAGNOSIS — H9201 Otalgia, right ear: Secondary | ICD-10-CM | POA: Diagnosis not present

## 2022-01-14 ENCOUNTER — Encounter: Payer: BC Managed Care – PPO | Admitting: Nurse Practitioner

## 2022-01-15 DIAGNOSIS — H60393 Other infective otitis externa, bilateral: Secondary | ICD-10-CM | POA: Diagnosis not present

## 2022-01-15 DIAGNOSIS — H6983 Other specified disorders of Eustachian tube, bilateral: Secondary | ICD-10-CM | POA: Diagnosis not present

## 2022-01-18 DIAGNOSIS — R0981 Nasal congestion: Secondary | ICD-10-CM | POA: Diagnosis not present

## 2022-01-18 DIAGNOSIS — H9201 Otalgia, right ear: Secondary | ICD-10-CM | POA: Diagnosis not present

## 2022-01-18 DIAGNOSIS — H6691 Otitis media, unspecified, right ear: Secondary | ICD-10-CM | POA: Diagnosis not present

## 2022-01-22 DIAGNOSIS — H6991 Unspecified Eustachian tube disorder, right ear: Secondary | ICD-10-CM | POA: Diagnosis not present

## 2022-01-22 DIAGNOSIS — E1165 Type 2 diabetes mellitus with hyperglycemia: Secondary | ICD-10-CM | POA: Diagnosis not present

## 2022-01-22 DIAGNOSIS — Z7984 Long term (current) use of oral hypoglycemic drugs: Secondary | ICD-10-CM | POA: Diagnosis not present

## 2022-01-22 DIAGNOSIS — H9201 Otalgia, right ear: Secondary | ICD-10-CM | POA: Diagnosis not present

## 2022-01-22 DIAGNOSIS — E869 Volume depletion, unspecified: Secondary | ICD-10-CM | POA: Diagnosis not present

## 2022-01-24 ENCOUNTER — Encounter (HOSPITAL_COMMUNITY): Payer: Self-pay

## 2022-01-24 ENCOUNTER — Ambulatory Visit: Payer: BC Managed Care – PPO | Admitting: Nurse Practitioner

## 2022-01-24 ENCOUNTER — Emergency Department (HOSPITAL_COMMUNITY)
Admission: EM | Admit: 2022-01-24 | Discharge: 2022-01-24 | Disposition: A | Discharge status: Home / Self Care | Payer: BC Managed Care – PPO | Attending: Emergency Medicine | Admitting: Emergency Medicine

## 2022-01-24 ENCOUNTER — Other Ambulatory Visit: Payer: Self-pay | Admitting: Internal Medicine

## 2022-01-24 DIAGNOSIS — E1165 Type 2 diabetes mellitus with hyperglycemia: Secondary | ICD-10-CM | POA: Insufficient documentation

## 2022-01-24 DIAGNOSIS — Z79899 Other long term (current) drug therapy: Secondary | ICD-10-CM | POA: Diagnosis not present

## 2022-01-24 DIAGNOSIS — Z7984 Long term (current) use of oral hypoglycemic drugs: Secondary | ICD-10-CM | POA: Diagnosis not present

## 2022-01-24 DIAGNOSIS — R739 Hyperglycemia, unspecified: Secondary | ICD-10-CM | POA: Diagnosis not present

## 2022-01-24 LAB — CBG MONITORING, ED
Glucose-Capillary: 271 mg/dL — ABNORMAL HIGH (ref 70–99)
Glucose-Capillary: 282 mg/dL — ABNORMAL HIGH (ref 70–99)

## 2022-01-24 MED ORDER — FREESTYLE LITE TEST VI STRP: ORAL_STRIP | 3 refills | Status: AC

## 2022-01-24 NOTE — Progress Notes (Deleted)
Assessment and Plan:  There are no diagnoses linked to this encounter.    Further disposition pending results of labs. Discussed med's effects and SE's.   Over 30 minutes of exam, counseling, chart review, and critical decision making was performed.   Future Appointments  Date Time Provider Department Center  01/24/2022 11:45 AM Raynelle Dick, NP [facility] None  04/10/2022  9:00 AM Raynelle Dick, NP [facility] None    ------------------------------------------------------------------------------------------------------------------   HPI There were no vitals taken for this visit. 36 y.o.male presents for  Past Medical History:  Diagnosis Date   Allergy    Hyperlipidemia    Hypertension    Obesity (BMI 30-39.9)    Type II or unspecified type diabetes mellitus without mention of complication, not stated as uncontrolled    Vitamin D deficiency      No Known Allergies  Current Outpatient Medications on File Prior to Visit  Medication Sig   atorvastatin (LIPITOR) 40 MG tablet TAKE 2 TABLETS(80 MG) BY MOUTH DAILY   azithromycin (ZITHROMAX) 250 MG tablet Take 2 tablets with Food on  Day 1, then 1 tablet Daily with Food for Sinusitis / Bronchitis   Cholecalciferol (VITAMIN D PO) Take 5,000 Int'l Units by mouth daily. (Patient not taking: Reported on 03/16/2020)   dexamethasone (DECADRON) 2 MG tablet Take 1 tab 3 x /day for 2 days,      then 2 x /day for 2  Days,     then 1 tab daily   glucose blood (FREESTYLE LITE) test strip Test sugar once daily DX E11.9   losartan (COZAAR) 100 MG tablet Take  1 tablet  Daily  for BP                                                        /                               TAKE                        BY                         MOUTH   metFORMIN (GLUCOPHAGE-XR) 500 MG 24 hr tablet Take  2 tablets  2 x /day with Meals for Diabetes                                      /                       TAKE                              BY                               MOUTH   promethazine-dextromethorphan (PROMETHAZINE-DM) 6.25-15 MG/5ML syrup Take 1 tsp every 4 hours if needed for cough   Semaglutide,0.25 or 0.5MG /DOS, (OZEMPIC, 0.25 OR 0.5 MG/DOSE,) 2 MG/1.5ML SOPN Inject 0.25 mg into your  skin weekly for 2 weeks, then increase to 0.5 mg weekly.   No current facility-administered medications on file prior to visit.    ROS: all negative except above.   Physical Exam:  There were no vitals taken for this visit.  General Appearance: Well nourished, in no apparent distress. Eyes: PERRLA, EOMs, conjunctiva no swelling or erythema Sinuses: No Frontal/maxillary tenderness ENT/Mouth: Ext aud canals clear, TMs without erythema, bulging. No erythema, swelling, or exudate on post pharynx.  Tonsils not swollen or erythematous. Hearing normal.  Neck: Supple, thyroid normal.  Respiratory: Respiratory effort normal, BS equal bilaterally without rales, rhonchi, wheezing or stridor.  Cardio: RRR with no MRGs. Brisk peripheral pulses without edema.  Abdomen: Soft, + BS.  Non tender, no guarding, rebound, hernias, masses. Lymphatics: Non tender without lymphadenopathy.  Musculoskeletal: Full ROM, 5/5 strength, normal gait.  Skin: Warm, dry without rashes, lesions, ecchymosis.  Neuro: Cranial nerves intact. Normal muscle tone, no cerebellar symptoms. Sensation intact.  Psych: Awake and oriented X 3, normal affect, Insight and Judgment appropriate.     Raynelle Dick, NP 8:51 AM [facility]

## 2022-01-24 NOTE — Discharge Instructions (Addendum)
Your glucose today is 282. This can be treated with IV fluids in the ER or you can go home and hydrate, take your Metformin and monitor your blood sugar and follow up with your PCP as planned. Monitor your diet, see diabetic diet recommendations in paperwork. Return to the ER for vomiting, increased thirst and urination, any concerning symptoms.

## 2022-01-24 NOTE — ED Provider Triage Note (Signed)
Emergency Medicine Provider Triage Evaluation Note  Louis Fitzpatrick , a 36 y.o. male  was evaluated in triage.  Pt complains of high blood sugar, hx DM, has not been taking Metformin consistently over a long period of time. No complaints.  Had a clogged ear, given steroids PO and IM.  CBG at home 345 (questions faulty test strips.  Review of Systems  Positive: As above Negative: Polyuria, polydipsia, polyphagia  Physical Exam  BP (!) 155/99 (BP Location: Left Arm)   Pulse 79   Temp 98.2 F (36.8 C) (Oral)   Resp 18   SpO2 100%  Gen:   Awake, no distress   Resp:  Normal effort  MSK:   Moves extremities without difficulty  Other:    Medical Decision Making  Medically screening exam initiated at 11:36 AM.  Appropriate orders placed.  Louis Fitzpatrick was informed that the remainder of the evaluation will be completed by another provider, this initial triage assessment does not replace that evaluation, and the importance of remaining in the ED until their evaluation is complete.     Jeannie Fend, PA-C 01/24/22 1136

## 2022-01-24 NOTE — ED Provider Notes (Signed)
[facility]-EMERGENCY DEPT Provider Note   CSN: [id number] Arrival date & time: 01/24/22  1127     History  Chief Complaint  Patient presents with   Hyperglycemia    Louis Fitzpatrick is a 36 y.o. male.  Pt complains of high blood sugar, hx DM, has not been taking Metformin consistently over a long period of time. No complaints, specifically denies polyuria, polydipsia. Had a clogged ear, given steroids PO and IM.  CBG at home 345 (questions faulty test strips.         Home Medications Prior to Admission medications   Medication Sig Start Date End Date Taking? Authorizing Provider  atorvastatin (LIPITOR) 40 MG tablet TAKE 2 TABLETS(80 MG) BY MOUTH DAILY 05/05/20   Elder Negus, NP  azithromycin (ZITHROMAX) 250 MG tablet Take 2 tablets with Food on  Day 1, then 1 tablet Daily with Food for Sinusitis / Bronchitis 06/28/20   Lucky Cowboy, MD  Cholecalciferol (VITAMIN D PO) Take 5,000 Int'l Units by mouth daily. Patient not taking: Reported on 03/16/2020    [provider]  dexamethasone (DECADRON) 2 MG tablet Take 1 tab 3 x /day for 2 days,      then 2 x /day for 2  Days,     then 1 tab daily 06/28/20   Lucky Cowboy, MD  glucose blood (FREESTYLE LITE) test strip Test sugar once daily DX E11.9 01/24/22   Jeannie Fend, PA-C  losartan (COZAAR) 100 MG tablet Take  1 tablet  Daily  for BP                                                        /                               TAKE                        BY                         MOUTH 06/08/21   Lucky Cowboy, MD  metFORMIN (GLUCOPHAGE-XR) 500 MG 24 hr tablet Take  2 tablets  2 x /day with Meals for Diabetes                                      /                       TAKE                              BY                              MOUTH 09/06/21   Lucky Cowboy, MD  promethazine-dextromethorphan (PROMETHAZINE-DM) 6.25-15 MG/5ML syrup Take 1 tsp every 4 hours if needed for cough 06/28/20   Lucky Cowboy, MD  Semaglutide,0.25 or 0.5MG /DOS, (OZEMPIC, 0.25 OR 0.5 MG/DOSE,) 2 MG/1.5ML SOPN Inject 0.25 mg into your skin weekly for 2 weeks, then  increase to 0.5 mg weekly. 05/21/20   Judd Gaudier, NP      Allergies    Patient has no known allergies.    Review of Systems   Review of Systems Negative except as per HPI Physical Exam Updated Vital Signs BP (!) 149/96   Pulse 85   Temp 98 F (36.7 C) (Oral)   Resp 17   SpO2 100%  Physical Exam Vitals and nursing note reviewed.  Constitutional:      General: He is not in acute distress.    Appearance: He is well-developed. He is not diaphoretic.  HENT:     Head: Normocephalic and atraumatic.  Pulmonary:     Effort: Pulmonary effort is normal.  Neurological:     Mental Status: He is alert and oriented to person, place, and time.  Psychiatric:        Behavior: Behavior normal.     ED Results / Procedures / Treatments   Labs (all labs ordered are listed, but only abnormal results are displayed) Labs Reviewed  CBG MONITORING, ED - Abnormal; Notable for the following components:      Result Value   Glucose-Capillary 282 (*)    All other components within normal limits  CBG MONITORING, ED - Abnormal; Notable for the following components:   Glucose-Capillary 271 (*)    All other components within normal limits    EKG None  Radiology No results found.  Procedures Procedures    Medications Ordered in ED Medications - No data to display  ED Course/ Medical Decision Making/ A&P                           Medical Decision Making  36 year old male with past medical history of diabetes presents with concern for elevated blood sugar with home CBG of 345.  Patient has not been compliant with regular use of his metformin, has not taken his metformin today.  Also recently treated for a clogged ear with oral as well as IM steroids, most recently on 01/22/22 (seen in the ER, had labs done that day.  Labs reviewed from  01/22/22: Mg WNL CMP Glucose 369, ALT 66, otherwise WNL (specifically gap and bicarb WNL).  CBC without significant findings Offered to repeat labs, labs above with hyperglycemia without evidence of DKA, patient declines. Discussed IVF to lower glucose with plan to take Metformin as prescribed, monitor CBG and recheck with PCP. Patient was concerned he would need to have IV insulin today and dc home on insulin. It seems reasonable that with current CBG improved from home, reassuring labs 2 days ago, no symptoms to suggest DKA today- to not start insulin- rather resume metformin, watch diet, ensure adequate fluid intake and follow up with PCP. Patient declines IVF at this time, will orally hydrate and monitor his CBG. Glucose also likely elevated due to recent steroid use- discussed this with patient as well, he is no longer on oral steroids.         Final Clinical Impression(s) / ED Diagnoses Final diagnoses:  Hyperglycemia    Rx / DC Orders ED Discharge Orders          Ordered    glucose blood (FREESTYLE LITE) test strip        01/24/22 1154              Jeannie Fend, PA-C 01/24/22 1325    Alvira Monday, MD 01/24/22 2316

## 2022-01-24 NOTE — ED Triage Notes (Addendum)
Pt arrived via POV c/o hyperglycemia. Recently given steroid injection. Has not been taking metformin. CBG 282 in triage. No sx at this time.

## 2022-01-28 ENCOUNTER — Ambulatory Visit (INDEPENDENT_AMBULATORY_CARE_PROVIDER_SITE_OTHER): Payer: BC Managed Care – PPO | Admitting: Nurse Practitioner

## 2022-01-28 ENCOUNTER — Ambulatory Visit: Payer: BC Managed Care – PPO | Admitting: Nurse Practitioner

## 2022-01-28 ENCOUNTER — Encounter: Payer: Self-pay | Admitting: Nurse Practitioner

## 2022-01-28 VITALS — BP 118/84 | HR 72 | Temp 97.7°F | Ht 69.0 in | Wt 217.8 lb

## 2022-01-28 DIAGNOSIS — Z79899 Other long term (current) drug therapy: Secondary | ICD-10-CM | POA: Diagnosis not present

## 2022-01-28 DIAGNOSIS — Z1389 Encounter for screening for other disorder: Secondary | ICD-10-CM | POA: Diagnosis not present

## 2022-01-28 DIAGNOSIS — E559 Vitamin D deficiency, unspecified: Secondary | ICD-10-CM | POA: Diagnosis not present

## 2022-01-28 DIAGNOSIS — E1165 Type 2 diabetes mellitus with hyperglycemia: Secondary | ICD-10-CM | POA: Diagnosis not present

## 2022-01-28 DIAGNOSIS — E1169 Type 2 diabetes mellitus with other specified complication: Secondary | ICD-10-CM | POA: Diagnosis not present

## 2022-01-28 DIAGNOSIS — I10 Essential (primary) hypertension: Secondary | ICD-10-CM | POA: Diagnosis not present

## 2022-01-28 DIAGNOSIS — Z91199 Patient's noncompliance with other medical treatment and regimen due to unspecified reason: Secondary | ICD-10-CM | POA: Diagnosis not present

## 2022-01-28 DIAGNOSIS — R7989 Other specified abnormal findings of blood chemistry: Secondary | ICD-10-CM

## 2022-01-28 DIAGNOSIS — E785 Hyperlipidemia, unspecified: Secondary | ICD-10-CM | POA: Diagnosis not present

## 2022-01-28 MED ORDER — ATORVASTATIN CALCIUM 40 MG PO TABS: 40.0000 mg | ORAL_TABLET | Freq: Every day | ORAL | 3 refills | Status: DC

## 2022-01-28 MED ORDER — LOSARTAN POTASSIUM 100 MG PO TABS: 100.0000 mg | ORAL_TABLET | Freq: Every day | ORAL | 3 refills | Status: DC

## 2022-01-28 MED ORDER — METFORMIN HCL 500 MG PO TABS: ORAL_TABLET | ORAL | 3 refills | Status: DC

## 2022-01-28 NOTE — Progress Notes (Signed)
FOLLOW UP  Assessment and Plan:   Hypertension Well controlled with current medications - Losartan 100 mg qd Monitor blood pressure at home; patient to call if consistently greater than 130/80 Continue DASH diet.   Reminder to go to the ER if any CP, SOB, nausea, dizziness, severe HA, changes vision/speech, left arm numbness and tingling and jaw pain.  Hyperlipidemia Associated with Type 2 Diabetes Mellitus(HCC) Started on Atorvastatin 40 mg QD- continue Continue low cholesterol diet and exercise.  Check lipid panel.   Type 2 Diabetes Mellitus uncontrolled(HCC) Take Metformin 500 mg 2 tabs BID regularly Add Ozempic 0.5 mg SQ QW Continue diet and exercise.  Perform daily foot/skin check, notify office of any concerning changes.  Follow up in 3 months Check A1C  Poor Compliance Has a much better attitude about complying with medications, diet, exercise  Obesity with co morbidities Long discussion about weight loss, diet, and exercise Recommended diet heavy in fruits and veggies and low in animal meats, cheeses, and dairy products, appropriate calorie intake Patient will work on decreasing saturated fats, simple carbs and increase activity Will follow up in 3 months  Elevated LFT Work on diet , exercise, weight loss.  Control blood sugars. Avoid alcohol and Tylenol - CMP  Vitamin D deficiency - Continue Vit D supplementation to maintain value in therapeutic level of 60-100 - Vit D  Medication Management - TSH - Magnesium  Screening for hematuria/proteinuria - Microalbumin /creatinine urine ratio  Continue diet and meds as discussed. Further disposition pending results of labs. Discussed med's effects and SE's.   Over 30 minutes of exam, counseling, chart review, and critical decision making was performed.   Future Appointments  Date Time Provider [place]  04/10/2022  9:00 AM Alycia Rossetti, NP [facility] None     ----------------------------------------------------------------------------------------------------------------------  HPI 37 y.o. male  presents for evaluation of hypertension, cholesterol, diabetes, weight and vitamin D deficiency.  He has been noncompliant with his medication use for diabetes, cholesterol and hypertension and last visit at our office was 03/16/20. He was seen in ER 01/24/22 for elevated blood sugar of 345 at home, had not been taking his Metformin as prescribed.  Glucose in ER was 271. Advised to return to PCP and restart medications Since being in the ER he has started taking medication regularly, and exercising and watching his diet.  Committed to following his medication regimen  BMI is Body mass index is 32.16 kg/m., he has been working on diet and exercise. He is trying to cut out simple sugars, losing weight and blood sugars have been improving. He is planning to start exercise, has a Psychologist, counselling.  Wt Readings from Last 3 Encounters:  01/28/22 217 lb 12.8 oz (98.8 kg)  03/16/20 236 lb (107 kg)  04/30/18 234 lb (106.1 kg)    His blood pressure has been controlled at home, Currently on Losartan 100 mg daily, today their BP is BP: 118/84  BP Readings from Last 3 Encounters:  01/28/22 118/84  01/24/22 (!) 149/96  03/16/20 (!) 136/94  He does workout. He denies chest pain, shortness of breath, dizziness.  He is on cholesterol medication, Atorvastatin 40 mg daily. His cholesterol is not at goal. The cholesterol last visit was:   Lab Results  Component Value Date   CHOL 249 (H) 03/16/2020   HDL 49 03/16/2020   LDLCALC 162 (H) 03/16/2020   TRIG 214 (H) 03/16/2020   CHOLHDL 5.1 (H) 03/16/2020    He has not been  working on diet and exercise for diabetes mellitus type 2- uncontrolled . He had not been taking his Metformin regularly but has since started taking regularly 500 mg 2 tabs BID. He did just get a new glucometer. Fasting blood sugar 241 last  time he checked.  Last A1C in the office was:  Lab Results  Component Value Date   HGBA1C >14.0 (H) 03/16/2020    Patient is on Vitamin D supplement.   Lab Results  Component Value Date   VD25OH 15 (L) 12/31/2015     Has a history of elevated liver enzymes. U/S 04/05/20 showed: Diffusely increased liver echogenicity, which is nonspecific but most commonly related to fatty infiltration. Lab Results  Component Value Date   ALT 71 (H) 03/16/2020   AST 30 03/16/2020   ALKPHOS 65 12/31/2015   BILITOT 0.7 03/16/2020      Current Medications:  Current Outpatient Medications on File Prior to Visit  Medication Sig   amoxicillin-clavulanate (AUGMENTIN) 875-125 MG tablet Take 1 tablet by mouth every 12 (twelve) hours.   atorvastatin (LIPITOR) 40 MG tablet Take by mouth.   Chlorpheniramine-Phenylephrine 4-10 MG tablet TAKE 1 TABLET EVERY 6 HOURS BY ORAL ROUTE AS NEEDED, FOR CONGESTION.   Cholecalciferol (VITAMIN D PO) Take 5,000 Int'l Units by mouth daily. BID   Fluticasone Propionate (FLONASE NA) Place into the nose.   losartan (COZAAR) 100 MG tablet Take 1 tablet by mouth daily.   metFORMIN (GLUCOPHAGE) 500 MG tablet Take by mouth.   glucose blood (FREESTYLE LITE) test strip Test sugar once daily DX E11.9 (Patient not taking: Reported on 01/28/2022)   No current facility-administered medications on file prior to visit.     Allergies: No Known Allergies   Medical History:  Past Medical History:  Diagnosis Date   Allergy    Hyperlipidemia    Hypertension    Obesity (BMI 30-39.9)    Type II or unspecified type diabetes mellitus without mention of complication, not stated as uncontrolled    Vitamin D deficiency    Family history- Reviewed and unchanged Social history- Reviewed and unchanged   Review of Systems:  Review of Systems  Constitutional:  Negative for chills, fever and weight loss.  HENT:  Negative for congestion and hearing loss.   Eyes:  Negative for blurred vision  and double vision.  Respiratory:  Negative for cough and shortness of breath.   Cardiovascular:  Negative for chest pain, palpitations, orthopnea and leg swelling.  Gastrointestinal:  Negative for abdominal pain, constipation, diarrhea, heartburn, nausea and vomiting.  Genitourinary: Negative.   Musculoskeletal:  Negative for falls, joint pain and myalgias.  Skin:  Negative for rash.  Neurological:  Negative for dizziness, tingling, tremors, loss of consciousness and headaches.  Psychiatric/Behavioral:  Negative for depression, memory loss and suicidal ideas.       Physical Exam: BP 118/84   Pulse 72   Temp 97.7 F (36.5 C)   Ht 5\' 9"  (1.753 m)   Wt 217 lb 12.8 oz (98.8 kg)   SpO2 97%   BMI 32.16 kg/m  Wt Readings from Last 3 Encounters:  01/28/22 217 lb 12.8 oz (98.8 kg)  03/16/20 236 lb (107 kg)  04/30/18 234 lb (106.1 kg)   General Appearance: Well nourished, in no apparent distress. Eyes: PERRLA, EOMs, conjunctiva no swelling or erythema Sinuses: No Frontal/maxillary tenderness ENT/Mouth: Ext aud canals clear, TMs without erythema, bulging. No erythema, swelling, or exudate on post pharynx.  Tonsils not swollen or erythematous. Hearing normal.  Neck: Supple, thyroid normal.  Respiratory: Respiratory effort normal, BS equal bilaterally without rales, rhonchi, wheezing or stridor.  Cardio: RRR with no MRGs. Brisk peripheral pulses without edema.  Abdomen: Soft, + BS.  Non tender, no guarding, rebound, hernias, masses. Lymphatics: Non tender without lymphadenopathy.  Musculoskeletal: Full ROM, 5/5 strength, Normal gait Skin: Warm, dry without rashes, lesions, ecchymosis.  Neuro: Cranial nerves intact. No cerebellar symptoms.  Psych: Awake and oriented X 3, normal affect, Insight and Judgment appropriate.    Alycia Rossetti, NP 1:51 PM [facility]

## 2022-01-28 NOTE — Patient Instructions (Signed)
Semaglutide Injection What is this medication? SEMAGLUTIDE (SEM a GLOO tide) treats type 2 diabetes. It works by increasing insulin levels in your body, which decreases your blood sugar (glucose). It also reduces the amount of sugar released into the blood and slows down your digestion. It can also be used to lower the risk of heart attack and stroke in people with type 2 diabetes. Changes to diet and exercise are often combined with this medication. This medicine may be used for other purposes; ask your health care provider or pharmacist if you have questions. COMMON BRAND NAME(S): OZEMPIC What should I tell my care team before I take this medication? They need to know if you have any of these conditions: Endocrine tumors (MEN 2) or if someone in your family had these tumors Eye disease, vision problems History of pancreatitis Kidney disease Stomach problems Thyroid cancer or if someone in your family had thyroid cancer An unusual or allergic reaction to semaglutide, other medications, foods, dyes, or preservatives Pregnant or trying to get pregnant Breast-feeding How should I use this medication? This medication is for injection under the skin of your upper leg (thigh), stomach area, or upper arm. It is given once every week (every 7 days). You will be taught how to prepare and give this medication. Use exactly as directed. Take your medication at regular intervals. Do not take it more often than directed. If you use this medication with insulin, you should inject this medication and the insulin separately. Do not mix them together. Do not give the injections right next to each other. Change (rotate) injection sites with each injection. It is important that you put your used needles and syringes in a special sharps container. Do not put them in a trash can. If you do not have a sharps container, call your pharmacist or care team to get one. A special MedGuide will be given to you by the  pharmacist with each prescription and refill. Be sure to read this information carefully each time. This medication comes with INSTRUCTIONS FOR USE. Ask your pharmacist for directions on how to use this medication. Read the information carefully. Talk to your pharmacist or care team if you have questions. Talk to your care team about the use of this medication in children. Special care may be needed. Overdosage: If you think you have taken too much of this medicine contact a poison control center or emergency room at once. NOTE: This medicine is only for you. Do not share this medicine with others. What if I miss a dose? If you miss a dose, take it as soon as you can within 5 days after the missed dose. Then take your next dose at your regular weekly time. If it has been longer than 5 days after the missed dose, do not take the missed dose. Take the next dose at your regular time. Do not take double or extra doses. If you have questions about a missed dose, contact your care team for advice. What may interact with this medication? Other medications for diabetes Many medications may cause changes in blood sugar, these include: Alcohol containing beverages Antiviral medications for HIV or AIDS Aspirin and aspirin-like medications Certain medications for blood pressure, heart disease, irregular heart beat Chromium Diuretics Male hormones, such as estrogens or progestins, birth control pills Fenofibrate Gemfibrozil Isoniazid Lanreotide Male hormones or anabolic steroids MAOIs like Carbex, Eldepryl, Marplan, Nardil, and Parnate Medications for weight loss Medications for allergies, asthma, cold, or cough Medications for depression,   anxiety, or psychotic disturbances Niacin Nicotine NSAIDs, medications for pain and inflammation, like ibuprofen or naproxen Octreotide Pasireotide Pentamidine Phenytoin Probenecid Quinolone antibiotics such as ciprofloxacin, levofloxacin, ofloxacin Some  herbal dietary supplements Steroid medications such as prednisone or cortisone Sulfamethoxazole; trimethoprim Thyroid hormones Some medications can hide the warning symptoms of low blood sugar (hypoglycemia). You may need to monitor your blood sugar more closely if you are taking one of these medications. These include: Beta-blockers, often used for high blood pressure or heart problems (examples include atenolol, metoprolol, propranolol) Clonidine Guanethidine Reserpine This list may not describe all possible interactions. Give your health care provider a list of all the medicines, herbs, non-prescription drugs, or dietary supplements you use. Also tell them if you smoke, drink alcohol, or use illegal drugs. Some items may interact with your medicine. What should I watch for while using this medication? Visit your care team for regular checks on your progress. Drink plenty of fluids while taking this medication. Check with your care team if you get an attack of severe diarrhea, nausea, and vomiting. The loss of too much body fluid can make it dangerous for you to take this medication. A test called the HbA1C (A1C) will be monitored. This is a simple blood test. It measures your blood sugar control over the last 2 to 3 months. You will receive this test every 3 to 6 months. Learn how to check your blood sugar. Learn the symptoms of low and high blood sugar and how to manage them. Always carry a quick-source of sugar with you in case you have symptoms of low blood sugar. Examples include hard sugar candy or glucose tablets. Make sure others know that you can choke if you eat or drink when you develop serious symptoms of low blood sugar, such as seizures or unconsciousness. They must get medical help at once. Tell your care team if you have high blood sugar. You might need to change the dose of your medication. If you are sick or exercising more than usual, you might need to change the dose of your  medication. Do not skip meals. Ask your care team if you should avoid alcohol. Many nonprescription cough and cold products contain sugar or alcohol. These can affect blood sugar. Pens should never be shared. Even if the needle is changed, sharing may result in passing of viruses like hepatitis or HIV. Wear a medical ID bracelet or chain, and carry a card that describes your disease and details of your medication and dosage times. Do not become pregnant while taking this medication. Women should inform their care team if they wish to become pregnant or think they might be pregnant. There is a potential for serious side effects to an unborn child. Talk to your care team for more information. What side effects may I notice from receiving this medication? Side effects that you should report to your care team as soon as possible: Allergic reactions--skin rash, itching, hives, swelling of the face, lips, tongue, or throat Change in vision Dehydration--increased thirst, dry mouth, feeling faint or lightheaded, headache, dark yellow or brown urine Gallbladder problems--severe stomach pain, nausea, vomiting, fever Heart palpitations--rapid, pounding, or irregular heartbeat Kidney injury--decrease in the amount of urine, swelling of the ankles, hands, or feet Pancreatitis--severe stomach pain that spreads to your back or gets worse after eating or when touched, fever, nausea, vomiting Thyroid cancer--new mass or lump in the neck, pain or trouble swallowing, trouble breathing, hoarseness Side effects that usually do not require medical   attention (report to your care team if they continue or are bothersome): Diarrhea Loss of appetite Nausea Stomach pain Vomiting This list may not describe all possible side effects. Call your doctor for medical advice about side effects. You may report side effects to FDA at 1-800-FDA-1088. Where should I keep my medication? Keep out of the reach of children. Store  unopened pens in a refrigerator between 2 and 8 degrees C (36 and 46 degrees F). Do not freeze. Protect from light and heat. After you first use the pen, it can be stored for 56 days at room temperature between 15 and 30 degrees C (59 and 86 degrees F) or in a refrigerator. Throw away your used pen after 56 days or after the expiration date, whichever comes first. Do not store your pen with the needle attached. If the needle is left on, medication may leak from the pen. NOTE: This sheet is a summary. It may not cover all possible information. If you have questions about this medicine, talk to your doctor, pharmacist, or health care provider.  2023 Elsevier/Gold Standard (2020-03-29 00:00:00)  

## 2022-01-29 ENCOUNTER — Other Ambulatory Visit: Payer: Self-pay | Admitting: Nurse Practitioner

## 2022-01-29 DIAGNOSIS — E1165 Type 2 diabetes mellitus with hyperglycemia: Secondary | ICD-10-CM

## 2022-01-29 LAB — MICROALBUMIN / CREATININE URINE RATIO
Creatinine, Urine: 236 mg/dL (ref 20–320)
Microalb Creat Ratio: 21 mcg/mg creat (ref <30)
Microalb, Ur: 5 mg/dL

## 2022-01-29 LAB — COMPLETE METABOLIC PANEL WITH GFR
AG Ratio: 2 (calc) (ref 1.0–2.5)
ALT: 40 U/L (ref 9–46)
AST: 18 U/L (ref 10–40)
Albumin: 4.5 g/dL (ref 3.6–5.1)
Alkaline phosphatase (APISO): 84 U/L (ref 36–130)
BUN: 13 mg/dL (ref 7–25)
CO2: 27 mmol/L (ref 20–32)
Calcium: 9.5 mg/dL (ref 8.6–10.3)
Chloride: 100 mmol/L (ref 98–110)
Creat: 0.86 mg/dL (ref 0.60–1.26)
Globulin: 2.2 g/dL (calc) (ref 1.9–3.7)
Glucose, Bld: 246 mg/dL — ABNORMAL HIGH (ref 65–99)
Potassium: 4 mmol/L (ref 3.5–5.3)
Sodium: 136 mmol/L (ref 135–146)
Total Bilirubin: 0.5 mg/dL (ref 0.2–1.2)
Total Protein: 6.7 g/dL (ref 6.1–8.1)
eGFR: 114 mL/min/{1.73_m2} (ref >=60)

## 2022-01-29 LAB — CBC WITH DIFFERENTIAL/PLATELET
Absolute Monocytes: 340 cells/uL (ref 200–950)
Basophils Absolute: 29 cells/uL (ref 0–200)
Basophils Relative: 0.7 %
Eosinophils Absolute: 90 cells/uL (ref 15–500)
Eosinophils Relative: 2.2 %
HCT: 41.8 % (ref 38.5–50.0)
Hemoglobin: 14.2 g/dL (ref 13.2–17.1)
Lymphs Abs: 1673 cells/uL (ref 850–3900)
MCH: 28 pg (ref 27.0–33.0)
MCHC: 34 g/dL (ref 32.0–36.0)
MCV: 82.4 fL (ref 80.0–100.0)
MPV: 11.1 fL (ref 7.5–12.5)
Monocytes Relative: 8.3 %
Neutro Abs: 1968 cells/uL (ref 1500–7800)
Neutrophils Relative %: 48 %
Platelets: 273 10*3/uL (ref 140–400)
RBC: 5.07 10*6/uL (ref 4.20–5.80)
RDW: 12.4 % (ref 11.0–15.0)
Total Lymphocyte: 40.8 %
WBC: 4.1 10*3/uL (ref 3.8–10.8)

## 2022-01-29 LAB — HEMOGLOBIN A1C
Hgb A1c MFr Bld: 12.8 % of total Hgb — ABNORMAL HIGH (ref <5.7)
Mean Plasma Glucose: 321 mg/dL
eAG (mmol/L): 17.8 mmol/L

## 2022-01-29 LAB — LIPID PANEL
Cholesterol: 154 mg/dL (ref <200)
HDL: 50 mg/dL (ref >=40)
LDL Cholesterol (Calc): 84 mg/dL (calc)
Non-HDL Cholesterol (Calc): 104 mg/dL (calc) (ref <130)
Total CHOL/HDL Ratio: 3.1 (calc) (ref <5.0)
Triglycerides: 108 mg/dL (ref <150)

## 2022-01-29 LAB — MAGNESIUM: Magnesium: 1.6 mg/dL (ref 1.5–2.5)

## 2022-01-29 LAB — VITAMIN D 25 HYDROXY (VIT D DEFICIENCY, FRACTURES): Vit D, 25-Hydroxy: 32 ng/mL (ref 30–100)

## 2022-01-29 LAB — TSH: TSH: 1.14 mIU/L (ref 0.40–4.50)

## 2022-01-29 MED ORDER — SEMAGLUTIDE(0.25 OR 0.5MG/DOS) 2 MG/3ML ~~LOC~~ SOPN: 0.5000 mg | PEN_INJECTOR | SUBCUTANEOUS | 3 refills | Status: DC

## 2022-01-30 DIAGNOSIS — H6991 Unspecified Eustachian tube disorder, right ear: Secondary | ICD-10-CM | POA: Diagnosis not present

## 2022-01-31 ENCOUNTER — Ambulatory Visit: Payer: BC Managed Care – PPO | Admitting: Nurse Practitioner

## 2022-02-10 NOTE — Progress Notes (Deleted)
Assessment and Plan:  There are no diagnoses linked to this encounter.    Further disposition pending results of labs. Discussed med's effects and SE's.   Over 30 minutes of exam, counseling, chart review, and critical decision making was performed.   Future Appointments  Date Time Provider [place]  02/11/2022 11:30 AM Alycia Rossetti, NP [facility] None  04/10/2022  9:00 AM Alycia Rossetti, NP [facility] None    ------------------------------------------------------------------------------------------------------------------   HPI There were no vitals taken for this visit. 37 y.o.male presents for  Past Medical History:  Diagnosis Date   Allergy    Hyperlipidemia    Hypertension    Obesity (BMI 30-39.9)    Type II or unspecified type diabetes mellitus without mention of complication, not stated as uncontrolled    Vitamin D deficiency      No Known Allergies  Current Outpatient Medications on File Prior to Visit  Medication Sig   amoxicillin-clavulanate (AUGMENTIN) 875-125 MG tablet Take 1 tablet by mouth every 12 (twelve) hours.   atorvastatin (LIPITOR) 40 MG tablet Take 1 tablet (40 mg total) by mouth daily.   Chlorpheniramine-Phenylephrine 4-10 MG tablet TAKE 1 TABLET EVERY 6 HOURS BY ORAL ROUTE AS NEEDED, FOR CONGESTION.   Cholecalciferol (VITAMIN D PO) Take 5,000 Int'l Units by mouth daily. BID   Fluticasone Propionate (FLONASE NA) Place into the nose.   glucose blood (FREESTYLE LITE) test strip Test sugar once daily DX E11.9 (Patient not taking: Reported on 01/28/2022)   losartan (COZAAR) 100 MG tablet Take 1 tablet (100 mg total) by mouth daily.   metFORMIN (GLUCOPHAGE) 500 MG tablet 2 tabs twice a day with meals   Semaglutide,0.25 or 0.5MG/DOS, 2 MG/3ML SOPN Inject 0.5 mg into the skin once a week.   No current facility-administered medications on file prior to visit.    ROS: all negative except above.   Physical Exam:  There were no vitals taken  for this visit.  General Appearance: Well nourished, in no apparent distress. Eyes: PERRLA, EOMs, conjunctiva no swelling or erythema Sinuses: No Frontal/maxillary tenderness ENT/Mouth: Ext aud canals clear, TMs without erythema, bulging. No erythema, swelling, or exudate on post pharynx.  Tonsils not swollen or erythematous. Hearing normal.  Neck: Supple, thyroid normal.  Respiratory: Respiratory effort normal, BS equal bilaterally without rales, rhonchi, wheezing or stridor.  Cardio: RRR with no MRGs. Brisk peripheral pulses without edema.  Abdomen: Soft, + BS.  Non tender, no guarding, rebound, hernias, masses. Lymphatics: Non tender without lymphadenopathy.  Musculoskeletal: Full ROM, 5/5 strength, normal gait.  Skin: Warm, dry without rashes, lesions, ecchymosis.  Neuro: Cranial nerves intact. Normal muscle tone, no cerebellar symptoms. Sensation intact.  Psych: Awake and oriented X 3, normal affect, Insight and Judgment appropriate.     Alycia Rossetti, NP 9:58 AM [facility]

## 2022-02-11 ENCOUNTER — Ambulatory Visit: Payer: BC Managed Care – PPO | Admitting: Nurse Practitioner

## 2022-02-13 ENCOUNTER — Ambulatory Visit: Payer: BC Managed Care – PPO | Admitting: Nurse Practitioner

## 2022-02-13 ENCOUNTER — Encounter: Payer: Self-pay | Admitting: Nurse Practitioner

## 2022-02-13 VITALS — HR 106 | Temp 97.0°F

## 2022-02-13 DIAGNOSIS — U071 COVID-19: Secondary | ICD-10-CM

## 2022-02-13 DIAGNOSIS — R051 Acute cough: Secondary | ICD-10-CM | POA: Diagnosis not present

## 2022-02-13 DIAGNOSIS — J0100 Acute maxillary sinusitis, unspecified: Secondary | ICD-10-CM | POA: Diagnosis not present

## 2022-02-13 MED ORDER — AZITHROMYCIN 500 MG PO TABS: 500.0000 mg | ORAL_TABLET | Freq: Every day | ORAL | 0 refills | Status: AC

## 2022-02-13 MED ORDER — PROMETHAZINE-DM 6.25-15 MG/5ML PO SYRP: 5.0000 mL | ORAL_SOLUTION | Freq: Four times a day (QID) | ORAL | 0 refills | Status: DC | PRN

## 2022-02-13 NOTE — Progress Notes (Signed)
THIS ENCOUNTER IS A VIRTUAL VISIT DUE TO COVID-19 - PATIENT WAS NOT SEEN IN THE OFFICE.  PATIENT HAS CONSENTED TO VIRTUAL VISIT / TELEMEDICINE VISIT   Virtual Visit via telephone Note  I connected with  Louis Fitzpatrick on 02/13/2022 by telephone.  I verified that I am speaking with the correct person using two identifiers.    I discussed the limitations of evaluation and management by telemedicine and the availability of in person appointments. The patient expressed understanding and agreed to proceed.  History of Present Illness:  Pulse (!) 106   Temp (!) 97 F (36.1 C)   SpO2 98%   37 y.o. patient contacted office reporting URI sx nasal congestion and cough. he tested positive by home test. OV was conducted by telephone to minimize exposure. This patient has vaccinated for covid 19, last 11/2021  Sx began 2 days ago with sore throat, however, has had nasal congestion x 1 month without relief causing sinus pressure and HA.    Treatments tried so far: Mucinex, Tylenol  Exposures: Social worker Event   Medications  Current Outpatient Medications (Endocrine & Metabolic):    metFORMIN (GLUCOPHAGE) 500 MG tablet, 2 tabs twice a day with meals   Semaglutide,0.25 or 0.5MG /DOS, 2 MG/3ML SOPN, Inject 0.5 mg into the skin once a week.  Current Outpatient Medications (Cardiovascular):    atorvastatin (LIPITOR) 40 MG tablet, Take 1 tablet (40 mg total) by mouth daily.   losartan (COZAAR) 100 MG tablet, Take 1 tablet (100 mg total) by mouth daily.  Current Outpatient Medications (Respiratory):    Chlorpheniramine-Phenylephrine 4-10 MG tablet, TAKE 1 TABLET EVERY 6 HOURS BY ORAL ROUTE AS NEEDED, FOR CONGESTION.   Fluticasone Propionate (FLONASE NA), Place into the nose.    Current Outpatient Medications (Other):    Cholecalciferol (VITAMIN D PO), Take 5,000 Int'l Units by mouth daily. BID   amoxicillin-clavulanate (AUGMENTIN) 875-125 MG tablet, Take 1 tablet by mouth every 12 (twelve)  hours.   glucose blood (FREESTYLE LITE) test strip, Test sugar once daily DX E11.9 (Patient not taking: Reported on 01/28/2022)  Allergies: No Known Allergies  Problem list He has Hyperlipidemia associated with type 2 diabetes mellitus ([place]); Hypertension; Uncontrolled diabetes mellitus ([place]); Allergy; Vitamin D deficiency; Morbid obesity ([place]); and Hepatic steatosis on their problem list.   Social History:   reports that he has never smoked. He has never used smokeless tobacco. He reports that he does not drink alcohol and does not use drugs.  Observations/Objective:  General : Well sounding patient in no apparent distress HEENT: no hoarseness, no cough for duration of visit Lungs: speaks in complete sentences, no audible wheezing, no apparent distress Neurological: alert, oriented x 3 Psychiatric: pleasant, judgement appropriate   Assessment and Plan:  Covid 19 Covid 19 positive per rapid screening test in home Risk factors include: DM2, HTN Symptoms are: mild Due to co morbid conditions and risk factors, discussed antivirals Paxlovi, Molnupiravir Immue support reviewed Vitamin C, Vitamin D, Zinc Take tylenol PRN temp 101+ Can take up to 4,000 mg in 24 hours Push hydration Regular ambulation or calf exercises exercises for clot prevention and 81 mg ASA unless contraindicated Sx supportive therapy suggested Follow up via mychart or telephone if needed Advised patient obtain O2 monitor; present to ED if persistently <90% or with severe dyspnea, CP, fever uncontrolled by tylenol, confusion, sudden decline       Should remain in isolation 5 days from testing positive and then wear a mask when around  other people for the following 5 days  1. COVID-19 Positive  2. Acute cough  - promethazine-dextromethorphan (PROMETHAZINE-DM) 6.25-15 MG/5ML syrup; Take 5 mLs by mouth 4 (four) times daily as needed for cough.  Dispense: 240 mL; Refill: 0  3. Acute non-recurrent maxillary  sinusitis  - azithromycin (ZITHROMAX) 500 MG tablet; Take 1 tablet (500 mg total) by mouth daily for 10 days.  Dispense: 10 tablet; Refill: 0   Follow Up Instructions:  I discussed the assessment and treatment plan with the patient. The patient was provided an opportunity to ask questions and all were answered. The patient agreed with the plan and demonstrated an understanding of the instructions.   The patient was advised to call back or seek an in-person evaluation if the symptoms worsen or if the condition fails to improve as anticipated.  I provided 15 minutes of non-face-to-face time during this encounter.   Darrol Jump, NP

## 2022-02-15 DIAGNOSIS — U071 COVID-19: Secondary | ICD-10-CM | POA: Diagnosis not present

## 2022-02-15 DIAGNOSIS — R0981 Nasal congestion: Secondary | ICD-10-CM | POA: Diagnosis not present

## 2022-02-15 DIAGNOSIS — R5383 Other fatigue: Secondary | ICD-10-CM | POA: Diagnosis not present

## 2022-02-21 ENCOUNTER — Other Ambulatory Visit: Payer: Self-pay | Admitting: Internal Medicine

## 2022-02-21 DIAGNOSIS — E1165 Type 2 diabetes mellitus with hyperglycemia: Secondary | ICD-10-CM

## 2022-02-21 MED ORDER — METFORMIN HCL 500 MG PO TABS: ORAL_TABLET | ORAL | 0 refills | Status: DC

## 2022-04-01 ENCOUNTER — Encounter: Payer: BC Managed Care – PPO | Admitting: Nurse Practitioner

## 2022-04-02 ENCOUNTER — Telehealth: Payer: Self-pay | Admitting: Nurse Practitioner

## 2022-04-02 NOTE — Telephone Encounter (Signed)
Pt was given a sample of Ozempic last time he was in while he tries to get it covered by his insurance. He is asking if we can set aside another sample for him

## 2022-04-02 NOTE — Telephone Encounter (Signed)
Please advise patient.  

## 2022-04-02 NOTE — Telephone Encounter (Signed)
Spoke with patient. He understands we can't keep giving him samples if he is not going to pick up his medication due to a high deductible. Spoke with his pharmacy and said it did not require a prior auth.

## 2022-04-10 ENCOUNTER — Other Ambulatory Visit: Payer: Self-pay | Admitting: Nurse Practitioner

## 2022-04-10 ENCOUNTER — Encounter: Payer: BC Managed Care – PPO | Admitting: Nurse Practitioner

## 2022-04-10 DIAGNOSIS — E1169 Type 2 diabetes mellitus with other specified complication: Secondary | ICD-10-CM

## 2022-05-19 DIAGNOSIS — L03031 Cellulitis of right toe: Secondary | ICD-10-CM | POA: Diagnosis not present

## 2022-05-19 DIAGNOSIS — M79674 Pain in right toe(s): Secondary | ICD-10-CM | POA: Diagnosis not present

## 2022-05-28 NOTE — Progress Notes (Deleted)
Complete Physical  Assessment and Plan:   Encounter for general adult medical examination with abnormal findings Due Yearly  Hypertension Well controlled with current medications - Losartan 100 mg qd Monitor blood pressure at home; patient to call if consistently greater than 130/80 Continue DASH diet.   Reminder to go to the ER if any CP, SOB, nausea, dizziness, severe HA, changes vision/speech, left arm numbness and tingling and jaw pain.  Hyperlipidemia Associated with Type 2 Diabetes Mellitus(HCC) Started on Atorvastatin 40 mg QD- continue Continue low cholesterol diet and exercise.  Check lipid panel.   Type 2 Diabetes Mellitus uncontrolled(HCC) Take Metformin 500 mg 2 tabs BID regularly Add Ozempic 0.5 mg SQ QW Continue diet and exercise.  Perform daily foot/skin check, notify office of any concerning changes.  Follow up in 3 months Check A1C  Poor Compliance Has a much better attitude about complying with medications, diet, exercise  Obesity with co morbidities Long discussion about weight loss, diet, and exercise Recommended diet heavy in fruits and veggies and low in animal meats, cheeses, and dairy products, appropriate calorie intake Patient will work on decreasing saturated fats, simple carbs and increase activity Will follow up in 3 months  Elevated LFT Work on diet , exercise, weight loss.  Control blood sugars. Avoid alcohol and Tylenol - CMP  Vitamin D deficiency - Continue Vit D supplementation to maintain value in therapeutic level of 60-100 - Vit D  Medication Management - TSH - Magnesium  Screening for hematuria/proteinuria - Microalbumin /creatinine urine ratio  Continue diet and meds as discussed. Further disposition pending results of labs. Discussed med's effects and SE's.   Over 30 minutes of exam, counseling, chart review, and critical decision making was performed.   Future Appointments  Date Time Provider Department Center  05/29/2022   9:00 AM Raynelle Dick, NP [facility] None    ----------------------------------------------------------------------------------------------------------------------  HPI 37 y.o. male  presents for evaluation of hypertension, cholesterol, diabetes, weight and vitamin D deficiency.  He has been noncompliant with his medication use for diabetes, cholesterol and hypertension and last visit at our office was 03/16/20. He was seen in ER 01/24/22 for elevated blood sugar of 345 at home, had not been taking his Metformin as prescribed.  Glucose in ER was 271. Advised to return to PCP and restart medications Since being in the ER he has started taking medication regularly, and exercising and watching his diet.  Committed to following his medication regimen  BMI is There is no height or weight on file to calculate BMI., he has been working on diet and exercise. He is trying to cut out simple sugars, losing weight and blood sugars have been improving. He is planning to start exercise, has a Geneticist, molecular.  Wt Readings from Last 3 Encounters:  01/28/22 217 lb 12.8 oz (98.8 kg)  03/16/20 236 lb (107 kg)  04/30/18 234 lb (106.1 kg)    His blood pressure has been controlled at home, Currently on Losartan 100 mg daily, today their BP is    BP Readings from Last 3 Encounters:  01/28/22 118/84  01/24/22 (!) 149/96  03/16/20 (!) 136/94  He does workout. He denies chest pain, shortness of breath, dizziness.  He is on cholesterol medication, Atorvastatin 40 mg daily. His cholesterol is not at goal. The cholesterol last visit was:   Lab Results  Component Value Date   CHOL 154 01/28/2022   HDL 50 01/28/2022   LDLCALC 84 01/28/2022   TRIG 108  01/28/2022   CHOLHDL 3.1 01/28/2022    He has not been working on diet and exercise for diabetes mellitus type 2- uncontrolled . He had not been taking his Metformin regularly but has since started taking regularly 500 mg 2 tabs BID. He did just get a  new glucometer. Fasting blood sugar 241 last time he checked.  Last A1C in the office was:  Lab Results  Component Value Date   HGBA1C 12.8 (H) 01/28/2022    Patient is on Vitamin D supplement.   Lab Results  Component Value Date   VD25OH 32 01/28/2022     Has a history of elevated liver enzymes. U/S 04/05/20 showed: Diffusely increased liver echogenicity, which is nonspecific but most commonly related to fatty infiltration. Lab Results  Component Value Date   ALT 40 01/28/2022   AST 18 01/28/2022   ALKPHOS 65 12/31/2015   BILITOT 0.5 01/28/2022      Current Medications:  Current Outpatient Medications on File Prior to Visit  Medication Sig   amoxicillin-clavulanate (AUGMENTIN) 875-125 MG tablet Take 1 tablet by mouth every 12 (twelve) hours.   atorvastatin (LIPITOR) 40 MG tablet TAKE 1 TABLET(40 MG) BY MOUTH DAILY   atorvastatin (LIPITOR) 40 MG tablet TAKE 1 TABLET(40 MG) BY MOUTH DAILY   Chlorpheniramine-Phenylephrine 4-10 MG tablet TAKE 1 TABLET EVERY 6 HOURS BY ORAL ROUTE AS NEEDED, FOR CONGESTION.   Cholecalciferol (VITAMIN D PO) Take 5,000 Int'l Units by mouth daily. BID   Fluticasone Propionate (FLONASE NA) Place into the nose.   glucose blood (FREESTYLE LITE) test strip Test sugar once daily DX E11.9 (Patient not taking: Reported on 01/28/2022)   losartan (COZAAR) 100 MG tablet Take 1 tablet (100 mg total) by mouth daily.   metFORMIN (GLUCOPHAGE) 500 MG tablet Take 2 tablets  2 x /day  with Meals for Diabetes   promethazine-dextromethorphan (PROMETHAZINE-DM) 6.25-15 MG/5ML syrup Take 5 mLs by mouth 4 (four) times daily as needed for cough.   Semaglutide,0.25 or 0.5MG /DOS, 2 MG/3ML SOPN Inject 0.5 mg into the skin once a week.   No current facility-administered medications on file prior to visit.     Allergies: No Known Allergies   Medical History:  Past Medical History:  Diagnosis Date   Allergy    Hyperlipidemia    Hypertension    Obesity (BMI 30-39.9)    Type  II or unspecified type diabetes mellitus without mention of complication, not stated as uncontrolled    Vitamin D deficiency    Family history- Reviewed and unchanged Social history- Reviewed and unchanged   Review of Systems:  Review of Systems  Constitutional:  Negative for chills, fever and weight loss.  HENT:  Negative for congestion and hearing loss.   Eyes:  Negative for blurred vision and double vision.  Respiratory:  Negative for cough and shortness of breath.   Cardiovascular:  Negative for chest pain, palpitations, orthopnea and leg swelling.  Gastrointestinal:  Negative for abdominal pain, constipation, diarrhea, heartburn, nausea and vomiting.  Genitourinary: Negative.   Musculoskeletal:  Negative for falls, joint pain and myalgias.  Skin:  Negative for rash.  Neurological:  Negative for dizziness, tingling, tremors, loss of consciousness and headaches.  Psychiatric/Behavioral:  Negative for depression, memory loss and suicidal ideas.       Physical Exam: There were no vitals taken for this visit. Wt Readings from Last 3 Encounters:  01/28/22 217 lb 12.8 oz (98.8 kg)  03/16/20 236 lb (107 kg)  04/30/18 234 lb (106.1 kg)  General Appearance: Well nourished, in no apparent distress. Eyes: PERRLA, EOMs, conjunctiva no swelling or erythema Sinuses: No Frontal/maxillary tenderness ENT/Mouth: Ext aud canals clear, TMs without erythema, bulging. No erythema, swelling, or exudate on post pharynx.  Tonsils not swollen or erythematous. Hearing normal.  Neck: Supple, thyroid normal.  Respiratory: Respiratory effort normal, BS equal bilaterally without rales, rhonchi, wheezing or stridor.  Cardio: RRR with no MRGs. Brisk peripheral pulses without edema.  Abdomen: Soft, + BS.  Non tender, no guarding, rebound, hernias, masses. Lymphatics: Non tender without lymphadenopathy.  Musculoskeletal: Full ROM, 5/5 strength, Normal gait Skin: Warm, dry without rashes, lesions,  ecchymosis.  Neuro: Cranial nerves intact. No cerebellar symptoms.  Psych: Awake and oriented X 3, normal affect, Insight and Judgment appropriate.    Raynelle Dick, NP 10:05 AM [facility]

## 2022-05-29 ENCOUNTER — Encounter: Payer: BC Managed Care – PPO | Admitting: Nurse Practitioner

## 2022-07-04 ENCOUNTER — Encounter: Payer: BC Managed Care – PPO | Admitting: Nurse Practitioner

## 2022-07-09 ENCOUNTER — Other Ambulatory Visit: Payer: Self-pay | Admitting: Nurse Practitioner

## 2022-07-09 ENCOUNTER — Other Ambulatory Visit: Payer: Self-pay

## 2022-07-09 DIAGNOSIS — E1165 Type 2 diabetes mellitus with hyperglycemia: Secondary | ICD-10-CM

## 2022-07-09 DIAGNOSIS — E1169 Type 2 diabetes mellitus with other specified complication: Secondary | ICD-10-CM

## 2022-07-09 DIAGNOSIS — I10 Essential (primary) hypertension: Secondary | ICD-10-CM

## 2022-07-09 MED ORDER — LOSARTAN POTASSIUM 100 MG PO TABS: 100.0000 mg | ORAL_TABLET | Freq: Every day | ORAL | 3 refills | Status: AC

## 2022-07-09 MED ORDER — ATORVASTATIN CALCIUM 40 MG PO TABS: 40.0000 mg | ORAL_TABLET | Freq: Every day | ORAL | 11 refills | Status: DC

## 2022-07-28 ENCOUNTER — Encounter: Payer: BC Managed Care – PPO | Admitting: Nurse Practitioner

## 2022-07-28 NOTE — Progress Notes (Unsigned)
Complete Physical Exam  Assessment and Plan:   Encounter for general adult medical examination with abnormal findings Due yearly  Hypertension Well controlled with current medications - Losartan 100 mg qd Monitor blood pressure at home; patient to call if consistently greater than 130/80 Continue DASH diet.   Reminder to go to the ER if any CP, SOB, nausea, dizziness, severe HA, changes vision/speech, left arm numbness and tingling and jaw pain.  Hyperlipidemia Associated with Type 2 Diabetes Mellitus(HCC) Started on Atorvastatin 40 mg QD- continue Continue low cholesterol diet and exercise.  Check lipid panel.   Type 2 Diabetes Mellitus uncontrolled(HCC) Take Metformin 500 mg 2 tabs BID regularly Add Ozempic 0.5 mg SQ QW Continue diet and exercise.  Perform daily foot/skin check, notify office of any concerning changes.  Follow up in 3 months Check A1C  Poor Compliance Has a much better attitude about complying with medications, diet, exercise  Obesity with co morbidities Long discussion about weight loss, diet, and exercise Recommended diet heavy in fruits and veggies and low in animal meats, cheeses, and dairy products, appropriate calorie intake Patient will work on decreasing saturated fats, simple carbs and increase activity Will follow up in 3 months  Elevated LFT Work on diet , exercise, weight loss.  Control blood sugars. Avoid alcohol and Tylenol - CMP  Vitamin D deficiency - Continue Vit D supplementation to maintain value in therapeutic level of 60-100 - Vit D  Medication Management - TSH - Magnesium  Screening for hematuria/proteinuria - Microalbumin /creatinine urine ratio  Continue diet and meds as discussed. Further disposition pending results of labs. Discussed med's effects and SE's.   Over 30 minutes of exam, counseling, chart review, and critical decision making was performed.   Future Appointments  Date Time Provider Department Center   07/29/2022 10:00 AM Raynelle Dick, NP [facility] None  07/28/2023  9:00 AM Adela Glimpse, NP [facility] None    ----------------------------------------------------------------------------------------------------------------------  HPI 37 y.o. male  presents for evaluation of hypertension, cholesterol, diabetes, weight and vitamin D deficiency.  He has been noncompliant with his medication use for diabetes, cholesterol and hypertension and last visit at our office was 03/16/20. He was seen in ER 01/24/22 for elevated blood sugar of 345 at home, had not been taking his Metformin as prescribed.  Glucose in ER was 271. Advised to return to PCP and restart medications Since being in the ER he has started taking medication regularly, and exercising and watching his diet.  Committed to following his medication regimen  BMI is There is no height or weight on file to calculate BMI., he has been working on diet and exercise. He is trying to cut out simple sugars, losing weight and blood sugars have been improving. He is planning to start exercise, has a Geneticist, molecular.  Wt Readings from Last 3 Encounters:  01/28/22 217 lb 12.8 oz (98.8 kg)  03/16/20 236 lb (107 kg)  04/30/18 234 lb (106.1 kg)    His blood pressure has been controlled at home, Currently on Losartan 100 mg daily, today their BP is    BP Readings from Last 3 Encounters:  01/28/22 118/84  01/24/22 (!) 149/96  03/16/20 (!) 136/94  He does workout. He denies chest pain, shortness of breath, dizziness.  He is on cholesterol medication, Atorvastatin 40 mg daily. His cholesterol is not at goal. The cholesterol last visit was:   Lab Results  Component Value Date   CHOL 154 01/28/2022   HDL 50  01/28/2022   LDLCALC 84 01/28/2022   TRIG 108 01/28/2022   CHOLHDL 3.1 01/28/2022    He has not been working on diet and exercise for diabetes mellitus type 2- uncontrolled . He had not been taking his Metformin regularly but has  since started taking regularly 500 mg 2 tabs BID. He did just get a new glucometer. Fasting blood sugar 241 last time he checked.  Last A1C in the office was:  Lab Results  Component Value Date   HGBA1C 12.8 (H) 01/28/2022    Patient is on Vitamin D supplement.   Lab Results  Component Value Date   VD25OH 32 01/28/2022     Has a history of elevated liver enzymes. U/S 04/05/20 showed: Diffusely increased liver echogenicity, which is nonspecific but most commonly related to fatty infiltration. Lab Results  Component Value Date   ALT 40 01/28/2022   AST 18 01/28/2022   ALKPHOS 65 12/31/2015   BILITOT 0.5 01/28/2022      Current Medications:  Current Outpatient Medications on File Prior to Visit  Medication Sig   amoxicillin-clavulanate (AUGMENTIN) 875-125 MG tablet Take 1 tablet by mouth every 12 (twelve) hours.   atorvastatin (LIPITOR) 40 MG tablet Take 1 tablet (40 mg total) by mouth daily.   Chlorpheniramine-Phenylephrine 4-10 MG tablet TAKE 1 TABLET EVERY 6 HOURS BY ORAL ROUTE AS NEEDED, FOR CONGESTION.   Cholecalciferol (VITAMIN D PO) Take 5,000 Int'l Units by mouth daily. BID   Fluticasone Propionate (FLONASE NA) Place into the nose.   glucose blood (FREESTYLE LITE) test strip Test sugar once daily DX E11.9 (Patient not taking: Reported on 01/28/2022)   losartan (COZAAR) 100 MG tablet Take 1 tablet (100 mg total) by mouth daily.   metFORMIN (GLUCOPHAGE) 500 MG tablet TAKE 2 TABLETS BY MOUTH TWICE DAILY WITH MEALS   promethazine-dextromethorphan (PROMETHAZINE-DM) 6.25-15 MG/5ML syrup Take 5 mLs by mouth 4 (four) times daily as needed for cough.   Semaglutide,0.25 or 0.5MG /DOS, 2 MG/3ML SOPN Inject 0.5 mg into the skin once a week.   No current facility-administered medications on file prior to visit.     Allergies: No Known Allergies   Medical History:  Past Medical History:  Diagnosis Date   Allergy    Hyperlipidemia    Hypertension    Obesity (BMI 30-39.9)    Type II  or unspecified type diabetes mellitus without mention of complication, not stated as uncontrolled    Vitamin D deficiency    Family history- Reviewed and unchanged Social history- Reviewed and unchanged   Review of Systems:  Review of Systems  Constitutional:  Negative for chills, fever and weight loss.  HENT:  Negative for congestion and hearing loss.   Eyes:  Negative for blurred vision and double vision.  Respiratory:  Negative for cough and shortness of breath.   Cardiovascular:  Negative for chest pain, palpitations, orthopnea and leg swelling.  Gastrointestinal:  Negative for abdominal pain, constipation, diarrhea, heartburn, nausea and vomiting.  Genitourinary: Negative.   Musculoskeletal:  Negative for falls, joint pain and myalgias.  Skin:  Negative for rash.  Neurological:  Negative for dizziness, tingling, tremors, loss of consciousness and headaches.  Psychiatric/Behavioral:  Negative for depression, memory loss and suicidal ideas.       Physical Exam: There were no vitals taken for this visit. Wt Readings from Last 3 Encounters:  01/28/22 217 lb 12.8 oz (98.8 kg)  03/16/20 236 lb (107 kg)  04/30/18 234 lb (106.1 kg)   General Appearance: Well  nourished, in no apparent distress. Eyes: PERRLA, EOMs, conjunctiva no swelling or erythema Sinuses: No Frontal/maxillary tenderness ENT/Mouth: Ext aud canals clear, TMs without erythema, bulging. No erythema, swelling, or exudate on post pharynx.  Tonsils not swollen or erythematous. Hearing normal.  Neck: Supple, thyroid normal.  Respiratory: Respiratory effort normal, BS equal bilaterally without rales, rhonchi, wheezing or stridor.  Cardio: RRR with no MRGs. Brisk peripheral pulses without edema.  Abdomen: Soft, + BS.  Non tender, no guarding, rebound, hernias, masses. Lymphatics: Non tender without lymphadenopathy.  Musculoskeletal: Full ROM, 5/5 strength, Normal gait Skin: Warm, dry without rashes, lesions, ecchymosis.   Neuro: Cranial nerves intact. No cerebellar symptoms.  Psych: Awake and oriented X 3, normal affect, Insight and Judgment appropriate.    Raynelle Dick, NP 8:35 AM [facility]

## 2022-07-29 ENCOUNTER — Encounter: Payer: Self-pay | Admitting: Nurse Practitioner

## 2022-07-29 ENCOUNTER — Ambulatory Visit (INDEPENDENT_AMBULATORY_CARE_PROVIDER_SITE_OTHER): Payer: BC Managed Care – PPO | Admitting: Nurse Practitioner

## 2022-07-29 VITALS — BP 124/82 | HR 76 | Temp 97.5°F | Ht 69.0 in | Wt 224.8 lb

## 2022-07-29 DIAGNOSIS — I10 Essential (primary) hypertension: Secondary | ICD-10-CM

## 2022-07-29 DIAGNOSIS — Z1329 Encounter for screening for other suspected endocrine disorder: Secondary | ICD-10-CM

## 2022-07-29 DIAGNOSIS — Z79899 Other long term (current) drug therapy: Secondary | ICD-10-CM | POA: Diagnosis not present

## 2022-07-29 DIAGNOSIS — E559 Vitamin D deficiency, unspecified: Secondary | ICD-10-CM | POA: Diagnosis not present

## 2022-07-29 DIAGNOSIS — E1165 Type 2 diabetes mellitus with hyperglycemia: Secondary | ICD-10-CM

## 2022-07-29 DIAGNOSIS — Z131 Encounter for screening for diabetes mellitus: Secondary | ICD-10-CM

## 2022-07-29 DIAGNOSIS — Z0000 Encounter for general adult medical examination without abnormal findings: Secondary | ICD-10-CM

## 2022-07-29 DIAGNOSIS — Z1389 Encounter for screening for other disorder: Secondary | ICD-10-CM | POA: Diagnosis not present

## 2022-07-29 DIAGNOSIS — Z0001 Encounter for general adult medical examination with abnormal findings: Secondary | ICD-10-CM

## 2022-07-29 DIAGNOSIS — Z13220 Encounter for screening for lipoid disorders: Secondary | ICD-10-CM | POA: Diagnosis not present

## 2022-07-29 DIAGNOSIS — Z136 Encounter for screening for cardiovascular disorders: Secondary | ICD-10-CM | POA: Diagnosis not present

## 2022-07-29 DIAGNOSIS — Z91199 Patient's noncompliance with other medical treatment and regimen due to unspecified reason: Secondary | ICD-10-CM

## 2022-07-29 DIAGNOSIS — R7989 Other specified abnormal findings of blood chemistry: Secondary | ICD-10-CM

## 2022-07-29 DIAGNOSIS — E1169 Type 2 diabetes mellitus with other specified complication: Secondary | ICD-10-CM

## 2022-07-29 MED ORDER — SILDENAFIL CITRATE 50 MG PO TABS
50.0000 mg | ORAL_TABLET | ORAL | 1 refills | Status: DC | PRN
Start: 2022-07-29 — End: 2023-01-15

## 2022-07-29 NOTE — Patient Instructions (Signed)

## 2022-07-30 ENCOUNTER — Other Ambulatory Visit: Payer: Self-pay | Admitting: Nurse Practitioner

## 2022-07-30 DIAGNOSIS — E669 Obesity, unspecified: Secondary | ICD-10-CM

## 2022-07-30 DIAGNOSIS — E1169 Type 2 diabetes mellitus with other specified complication: Secondary | ICD-10-CM

## 2022-07-30 LAB — COMPLETE METABOLIC PANEL WITH GFR
AG Ratio: 1.9 (calc) (ref 1.0–2.5)
ALT: 64 U/L — ABNORMAL HIGH (ref 9–46)
AST: 26 U/L (ref 10–40)
Albumin: 4.8 g/dL (ref 3.6–5.1)
Alkaline phosphatase (APISO): 84 U/L (ref 36–130)
BUN: 13 mg/dL (ref 7–25)
CO2: 28 mmol/L (ref 20–32)
Calcium: 10.1 mg/dL (ref 8.6–10.3)
Chloride: 99 mmol/L (ref 98–110)
Creat: 0.94 mg/dL (ref 0.60–1.26)
Globulin: 2.5 g/dL (calc) (ref 1.9–3.7)
Glucose, Bld: 305 mg/dL — ABNORMAL HIGH (ref 65–99)
Potassium: 5.1 mmol/L (ref 3.5–5.3)
Sodium: 136 mmol/L (ref 135–146)
Total Bilirubin: 0.6 mg/dL (ref 0.2–1.2)
Total Protein: 7.3 g/dL (ref 6.1–8.1)
eGFR: 107 mL/min/{1.73_m2} (ref >=60)

## 2022-07-30 LAB — CBC WITH DIFFERENTIAL/PLATELET
Absolute Monocytes: 281 cells/uL (ref 200–950)
Basophils Absolute: 19 cells/uL (ref 0–200)
Basophils Relative: 0.5 %
Eosinophils Absolute: 41 cells/uL (ref 15–500)
Eosinophils Relative: 1.1 %
HCT: 44.1 % (ref 38.5–50.0)
Hemoglobin: 14.4 g/dL (ref 13.2–17.1)
Lymphs Abs: 1721 cells/uL (ref 850–3900)
MCH: 28 pg (ref 27.0–33.0)
MCHC: 32.7 g/dL (ref 32.0–36.0)
MCV: 85.8 fL (ref 80.0–100.0)
MPV: 10.7 fL (ref 7.5–12.5)
Monocytes Relative: 7.6 %
Neutro Abs: 1639 cells/uL (ref 1500–7800)
Neutrophils Relative %: 44.3 %
Platelets: 271 10*3/uL (ref 140–400)
RBC: 5.14 10*6/uL (ref 4.20–5.80)
RDW: 12.4 % (ref 11.0–15.0)
Total Lymphocyte: 46.5 %
WBC: 3.7 10*3/uL — ABNORMAL LOW (ref 3.8–10.8)

## 2022-07-30 LAB — MICROALBUMIN / CREATININE URINE RATIO
Creatinine, Urine: 204 mg/dL (ref 20–320)
Microalb Creat Ratio: 19 mg/g creat (ref <30)
Microalb, Ur: 3.9 mg/dL

## 2022-07-30 LAB — URINALYSIS, ROUTINE W REFLEX MICROSCOPIC
Bilirubin Urine: NEGATIVE
Hgb urine dipstick: NEGATIVE
Ketones, ur: NEGATIVE
Leukocytes,Ua: NEGATIVE
Nitrite: NEGATIVE
Protein, ur: NEGATIVE
Specific Gravity, Urine: 1.04 — ABNORMAL HIGH (ref 1.001–1.035)
pH: 5.5 (ref 5.0–8.0)

## 2022-07-30 LAB — LIPID PANEL
Cholesterol: 191 mg/dL (ref <200)
HDL: 49 mg/dL (ref >=40)
LDL Cholesterol (Calc): 117 mg/dL (calc) — ABNORMAL HIGH
Non-HDL Cholesterol (Calc): 142 mg/dL (calc) — ABNORMAL HIGH (ref <130)
Total CHOL/HDL Ratio: 3.9 (calc) (ref <5.0)
Triglycerides: 141 mg/dL (ref <150)

## 2022-07-30 LAB — TSH: TSH: 1.76 mIU/L (ref 0.40–4.50)

## 2022-07-30 LAB — MAGNESIUM: Magnesium: 1.8 mg/dL (ref 1.5–2.5)

## 2022-07-30 LAB — HEMOGLOBIN A1C
Hgb A1c MFr Bld: 10.9 % of total Hgb — ABNORMAL HIGH (ref <5.7)
Mean Plasma Glucose: 266 mg/dL
eAG (mmol/L): 14.7 mmol/L

## 2022-07-30 LAB — VITAMIN D 25 HYDROXY (VIT D DEFICIENCY, FRACTURES): Vit D, 25-Hydroxy: 39 ng/mL (ref 30–100)

## 2022-07-30 MED ORDER — GLIPIZIDE 5 MG PO TABS
ORAL_TABLET | ORAL | 11 refills | Status: AC
Start: 2022-07-30 — End: ?

## 2022-07-30 MED ORDER — ATORVASTATIN CALCIUM 80 MG PO TABS
80.0000 mg | ORAL_TABLET | Freq: Every day | ORAL | 11 refills | Status: AC
Start: 2022-07-30 — End: 2023-07-30

## 2022-08-14 ENCOUNTER — Ambulatory Visit: Payer: BC Managed Care – PPO | Admitting: Internal Medicine

## 2022-09-02 ENCOUNTER — Encounter: Payer: BC Managed Care – PPO | Admitting: Internal Medicine

## 2022-11-10 ENCOUNTER — Ambulatory Visit: Payer: BC Managed Care – PPO | Admitting: Nurse Practitioner

## 2022-11-13 DIAGNOSIS — S29011A Strain of muscle and tendon of front wall of thorax, initial encounter: Secondary | ICD-10-CM | POA: Diagnosis not present

## 2022-11-25 ENCOUNTER — Other Ambulatory Visit: Payer: Self-pay | Admitting: Nurse Practitioner

## 2022-11-25 DIAGNOSIS — E1165 Type 2 diabetes mellitus with hyperglycemia: Secondary | ICD-10-CM

## 2022-12-17 DIAGNOSIS — H6991 Unspecified Eustachian tube disorder, right ear: Secondary | ICD-10-CM | POA: Diagnosis not present

## 2023-01-15 ENCOUNTER — Telehealth: Payer: Self-pay | Admitting: Nurse Practitioner

## 2023-01-15 ENCOUNTER — Other Ambulatory Visit: Payer: Self-pay | Admitting: Nurse Practitioner

## 2023-01-15 DIAGNOSIS — E1169 Type 2 diabetes mellitus with other specified complication: Secondary | ICD-10-CM

## 2023-01-15 MED ORDER — SILDENAFIL CITRATE 50 MG PO TABS
50.0000 mg | ORAL_TABLET | ORAL | 1 refills | Status: AC | PRN
Start: 2023-01-15 — End: 2024-01-15

## 2023-01-15 NOTE — Telephone Encounter (Signed)
Patient is requesting a refill on Sildenafil. But he would like it sent to a new pharmacy. [facility] on [address] In [place]

## 2023-01-16 ENCOUNTER — Ambulatory Visit: Payer: BC Managed Care – PPO | Admitting: Nurse Practitioner

## 2023-03-27 ENCOUNTER — Ambulatory Visit: Payer: BC Managed Care – PPO | Admitting: Nurse Practitioner

## 2023-05-06 DIAGNOSIS — E11649 Type 2 diabetes mellitus with hypoglycemia without coma: Secondary | ICD-10-CM | POA: Diagnosis not present

## 2023-05-06 DIAGNOSIS — E782 Mixed hyperlipidemia: Secondary | ICD-10-CM | POA: Diagnosis not present

## 2023-05-06 DIAGNOSIS — N521 Erectile dysfunction due to diseases classified elsewhere: Secondary | ICD-10-CM | POA: Diagnosis not present

## 2023-05-06 DIAGNOSIS — E119 Type 2 diabetes mellitus without complications: Secondary | ICD-10-CM | POA: Diagnosis not present

## 2023-05-06 DIAGNOSIS — I10 Essential (primary) hypertension: Secondary | ICD-10-CM | POA: Diagnosis not present

## 2023-05-20 DIAGNOSIS — I10 Essential (primary) hypertension: Secondary | ICD-10-CM | POA: Diagnosis not present

## 2023-05-20 DIAGNOSIS — E119 Type 2 diabetes mellitus without complications: Secondary | ICD-10-CM | POA: Diagnosis not present

## 2023-06-12 DIAGNOSIS — H6641 Suppurative otitis media, unspecified, right ear: Secondary | ICD-10-CM | POA: Diagnosis not present

## 2023-06-12 DIAGNOSIS — H6593 Unspecified nonsuppurative otitis media, bilateral: Secondary | ICD-10-CM | POA: Diagnosis not present

## 2023-06-12 DIAGNOSIS — H9201 Otalgia, right ear: Secondary | ICD-10-CM | POA: Diagnosis not present

## 2023-06-17 DIAGNOSIS — E119 Type 2 diabetes mellitus without complications: Secondary | ICD-10-CM | POA: Diagnosis not present

## 2023-06-17 DIAGNOSIS — Z23 Encounter for immunization: Secondary | ICD-10-CM | POA: Diagnosis not present

## 2023-06-17 DIAGNOSIS — I10 Essential (primary) hypertension: Secondary | ICD-10-CM | POA: Diagnosis not present

## 2023-07-07 DIAGNOSIS — H6981 Other specified disorders of Eustachian tube, right ear: Secondary | ICD-10-CM | POA: Diagnosis not present

## 2023-07-07 DIAGNOSIS — H9311 Tinnitus, right ear: Secondary | ICD-10-CM | POA: Diagnosis not present

## 2023-07-07 DIAGNOSIS — H6501 Acute serous otitis media, right ear: Secondary | ICD-10-CM | POA: Diagnosis not present

## 2023-07-16 DIAGNOSIS — H6991 Unspecified Eustachian tube disorder, right ear: Secondary | ICD-10-CM | POA: Diagnosis not present

## 2023-07-16 DIAGNOSIS — H938X1 Other specified disorders of right ear: Secondary | ICD-10-CM | POA: Diagnosis not present

## 2023-07-28 ENCOUNTER — Encounter: Payer: BC Managed Care – PPO | Admitting: Nurse Practitioner

## 2023-07-29 ENCOUNTER — Encounter: Payer: BC Managed Care – PPO | Admitting: Nurse Practitioner

## 2023-09-04 ENCOUNTER — Other Ambulatory Visit: Payer: Self-pay | Admitting: Nurse Practitioner

## 2023-09-04 DIAGNOSIS — E1169 Type 2 diabetes mellitus with other specified complication: Secondary | ICD-10-CM

## 2023-09-16 DIAGNOSIS — J309 Allergic rhinitis, unspecified: Secondary | ICD-10-CM | POA: Diagnosis not present

## 2023-09-16 DIAGNOSIS — H938X1 Other specified disorders of right ear: Secondary | ICD-10-CM | POA: Diagnosis not present

## 2023-09-16 DIAGNOSIS — H6991 Unspecified Eustachian tube disorder, right ear: Secondary | ICD-10-CM | POA: Diagnosis not present

## 2023-09-16 DIAGNOSIS — H9201 Otalgia, right ear: Secondary | ICD-10-CM | POA: Diagnosis not present

## 2023-09-29 DIAGNOSIS — Z23 Encounter for immunization: Secondary | ICD-10-CM | POA: Diagnosis not present

## 2023-09-29 DIAGNOSIS — Z6829 Body mass index (BMI) 29.0-29.9, adult: Secondary | ICD-10-CM | POA: Diagnosis not present

## 2023-09-29 DIAGNOSIS — Z0000 Encounter for general adult medical examination without abnormal findings: Secondary | ICD-10-CM | POA: Diagnosis not present

## 2023-09-29 DIAGNOSIS — Z0001 Encounter for general adult medical examination with abnormal findings: Secondary | ICD-10-CM | POA: Diagnosis not present

## 2023-09-29 DIAGNOSIS — Z7985 Long-term (current) use of injectable non-insulin antidiabetic drugs: Secondary | ICD-10-CM | POA: Diagnosis not present

## 2023-09-29 DIAGNOSIS — E119 Type 2 diabetes mellitus without complications: Secondary | ICD-10-CM | POA: Diagnosis not present

## 2023-09-29 DIAGNOSIS — I10 Essential (primary) hypertension: Secondary | ICD-10-CM | POA: Diagnosis not present

## 2023-09-29 DIAGNOSIS — E782 Mixed hyperlipidemia: Secondary | ICD-10-CM | POA: Diagnosis not present

## 2024-01-04 DIAGNOSIS — Z23 Encounter for immunization: Secondary | ICD-10-CM | POA: Diagnosis not present

## 2024-01-04 DIAGNOSIS — E119 Type 2 diabetes mellitus without complications: Secondary | ICD-10-CM | POA: Diagnosis not present
